# Patient Record
Sex: Male | Born: 1995 | Race: White | Hispanic: No | Marital: Single | Smoking: Never smoker
Health system: Southern US, Community
[De-identification: ages and names within clinical notes are randomized; demographics above are authoritative.]

## PROBLEM LIST (undated history)

## (undated) ENCOUNTER — Emergency Department (HOSPITAL_COMMUNITY): Admission: EM | Payer: Self-pay | Source: Home / Self Care

## (undated) DIAGNOSIS — B019 Varicella without complication: Secondary | ICD-10-CM

## (undated) DIAGNOSIS — F909 Attention-deficit hyperactivity disorder, unspecified type: Secondary | ICD-10-CM

## (undated) DIAGNOSIS — T8859XA Other complications of anesthesia, initial encounter: Secondary | ICD-10-CM

## (undated) DIAGNOSIS — H539 Unspecified visual disturbance: Secondary | ICD-10-CM

## (undated) DIAGNOSIS — T4145XA Adverse effect of unspecified anesthetic, initial encounter: Secondary | ICD-10-CM

## (undated) HISTORY — PX: WISDOM TOOTH EXTRACTION: SHX21

## (undated) HISTORY — PX: ADENOIDECTOMY: SUR15

## (undated) HISTORY — PX: TONSILLECTOMY: SUR1361

---
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
## 2016-01-10 ENCOUNTER — Other Ambulatory Visit: Payer: Self-pay | Admitting: Physician Assistant

## 2016-01-10 NOTE — Telephone Encounter (Signed)
Please review and advise.

## 2016-01-26 DIAGNOSIS — J0100 Acute maxillary sinusitis, unspecified: Secondary | ICD-10-CM | POA: Diagnosis not present

## 2016-02-07 ENCOUNTER — Other Ambulatory Visit: Payer: Self-pay | Admitting: Physician Assistant

## 2016-02-07 MED ORDER — CLONIDINE HCL 0.2 MG PO TABS: ORAL_TABLET | ORAL | 0 refills | Status: DC

## 2016-02-07 NOTE — Telephone Encounter (Signed)
Prescription sent to pharmacy.

## 2016-03-27 ENCOUNTER — Other Ambulatory Visit: Payer: Self-pay | Admitting: Physician Assistant

## 2016-04-11 DIAGNOSIS — R6889 Other general symptoms and signs: Secondary | ICD-10-CM | POA: Diagnosis not present

## 2016-04-11 DIAGNOSIS — K529 Noninfective gastroenteritis and colitis, unspecified: Secondary | ICD-10-CM | POA: Diagnosis not present

## 2016-06-20 ENCOUNTER — Other Ambulatory Visit: Payer: Self-pay | Admitting: Physician Assistant

## 2016-07-18 ENCOUNTER — Ambulatory Visit (INDEPENDENT_AMBULATORY_CARE_PROVIDER_SITE_OTHER): Payer: BLUE CROSS/BLUE SHIELD | Admitting: Physician Assistant

## 2016-07-18 DIAGNOSIS — R1314 Dysphagia, pharyngoesophageal phase: Secondary | ICD-10-CM | POA: Diagnosis not present

## 2016-07-18 DIAGNOSIS — B353 Tinea pedis: Secondary | ICD-10-CM

## 2016-07-18 DIAGNOSIS — F5101 Primary insomnia: Secondary | ICD-10-CM

## 2016-07-18 MED ORDER — OMEPRAZOLE 20 MG PO CPDR: 20.0000 mg | DELAYED_RELEASE_CAPSULE | Freq: Every day | ORAL | 3 refills | Status: DC

## 2016-07-18 MED ORDER — ECONAZOLE NITRATE 1 % EX CREA: TOPICAL_CREAM | Freq: Every day | CUTANEOUS | 0 refills | Status: DC

## 2016-07-18 MED ORDER — TRAZODONE HCL 50 MG PO TABS: 50.0000 mg | ORAL_TABLET | Freq: Every evening | ORAL | 2 refills | Status: DC | PRN

## 2016-07-18 NOTE — Patient Instructions (Signed)

## 2016-07-21 ENCOUNTER — Encounter: Payer: Self-pay | Admitting: Physician Assistant

## 2016-07-21 DIAGNOSIS — R1314 Dysphagia, pharyngoesophageal phase: Secondary | ICD-10-CM | POA: Insufficient documentation

## 2016-07-21 DIAGNOSIS — F5101 Primary insomnia: Secondary | ICD-10-CM | POA: Insufficient documentation

## 2016-07-21 NOTE — Progress Notes (Signed)
There were no vitals taken for this visit.   Subjective:    Patient ID: James Gilbert, male    DOB: 1995/02/09, 21 y.o.   MRN: [id number]  HPI: James Gilbert is a 21 y.o. male presenting on 07/18/2016 for Choking on food and Wants feet checked  This patient comes in for periodic recheck on medications and conditions including dysphagia over 6-12 months, continuing to worsens. Notices with bread and meat. The discomfort occurs ar the area just below the larynx.   Also having worsening insomnia, clonidine does not seem to be working anymore. Also some peeling and fungus on the side of his feet.   All medications are reviewed today. There are no reports of any problems with the medications. All of the medical conditions are reviewed and updated.  Lab work is reviewed and will be ordered as medically necessary. There are no new problems reported with today's visit.   Relevant past medical, surgical, family and social history reviewed and updated as indicated. Allergies and medications reviewed and updated.  Past Medical History:  Diagnosis Date  . ADHD (attention deficit hyperactivity disorder)   . Anesthesia complication    nausea/ vomitting  . Varicella   . Vision abnormalities    wears glass    Past Surgical History:  Procedure Laterality Date  . ADENOIDECTOMY    . ORIF FEMUR FRACTURE Right 06/22/2013   Procedure: OPEN REDUCTION INTERNAL FIXATION MEDIAL CONDYLE;  Surgeon: Cammy Copa, MD;  Location: [facility] OR;  Service: Orthopedics;  Laterality: Right;  OPEN REDUCTION INTERNAL FIXATION MEDIAL CONDYLE  . ORIF TIBIA PLATEAU Right 06/22/2013   Procedure: OPEN REDUCTION INTERNAL FIXATION (ORIF) TIBIAL PLATEAU;  Surgeon: Cammy Copa, MD;  Location: [facility] OR;  Service: Orthopedics;  Laterality: Right;  . TONSILLECTOMY      Review of Systems  Constitutional: Negative.  Negative for appetite change and fatigue.  HENT: Negative.   Eyes: Negative.  Negative for pain and  visual disturbance.  Respiratory: Negative.  Negative for cough, chest tightness, shortness of breath and wheezing.   Cardiovascular: Negative.  Negative for chest pain, palpitations and leg swelling.  Gastrointestinal: Negative.  Negative for abdominal pain, diarrhea, nausea and vomiting.  Endocrine: Negative.   Genitourinary: Negative.   Musculoskeletal: Negative.   Skin: Negative.  Negative for color change and rash.  Neurological: Negative.  Negative for weakness, numbness and headaches.  Psychiatric/Behavioral: Negative.     Allergies as of 07/18/2016   No Known Allergies     Medication List       Accurate as of 07/18/16 11:59 PM. Always use your most recent med list.          econazole nitrate 1 % cream Apply topically daily.   omeprazole 20 MG capsule Commonly known as:  PRILOSEC Take 1 capsule (20 mg total) by mouth daily.   traZODone 50 MG tablet Commonly known as:  DESYREL Take 1-2 tablets (50-100 mg total) by mouth at bedtime as needed for sleep.          Objective:    There were no vitals taken for this visit.  No Known Allergies  Physical Exam  Constitutional: He appears well-developed and well-nourished.  HENT:  Head: Normocephalic and atraumatic.  Eyes: Conjunctivae and EOM are normal. Pupils are equal, round, and reactive to light.  Neck: Normal range of motion. Neck supple.  Cardiovascular: Normal rate, regular rhythm and normal heart sounds.   Pulmonary/Chest: Effort normal and breath sounds normal.  Abdominal: Soft. Bowel sounds are normal.  Musculoskeletal: Normal range of motion.  Skin: Skin is warm and dry. Rash noted. There is erythema.        Assessment & Plan:   1. Pharyngoesophageal dysphagia - Ambulatory referral to Gastroenterology - omeprazole (PRILOSEC) 20 MG capsule; Take 1 capsule (20 mg total) by mouth daily.  Dispense: 30 capsule; Refill: 3  2. Primary insomnia - traZODone (DESYREL) 50 MG tablet; Take 1-2 tablets  (50-100 mg total) by mouth at bedtime as needed for sleep.  Dispense: 60 tablet; Refill: 2   Continue all other maintenance medications as listed above.  Follow up plan: Return in about 3 months (around 10/18/2016).  Educational handout given for GERD  James LofflerAngel S. Basha Krygier PA-C [facility] [address]  [place], [place] [address] [phone]   07/21/2016, 10:42 PM

## 2016-07-22 ENCOUNTER — Telehealth: Payer: Self-pay | Admitting: Physician Assistant

## 2016-07-22 DIAGNOSIS — F5101 Primary insomnia: Secondary | ICD-10-CM

## 2016-07-22 MED ORDER — TRAZODONE HCL 100 MG PO TABS: 200.0000 mg | ORAL_TABLET | Freq: Every evening | ORAL | 2 refills | Status: DC | PRN

## 2016-07-22 NOTE — Telephone Encounter (Signed)
Per pt's mom Trazadone is not working for sleep Pt is taking 2 pills and is not sleeping at all Please advise

## 2016-07-22 NOTE — Telephone Encounter (Signed)
Pt's mother notified of script Asked for [facility] to be [facility] [place] Canceled at [facility] and resubmitted to the Drug [facility] per pt request

## 2016-07-24 ENCOUNTER — Encounter: Payer: Self-pay | Admitting: Gastroenterology

## 2016-07-30 ENCOUNTER — Other Ambulatory Visit: Payer: Self-pay | Admitting: Physician Assistant

## 2016-07-30 ENCOUNTER — Telehealth: Payer: Self-pay | Admitting: Physician Assistant

## 2016-07-30 MED ORDER — CLONIDINE HCL 0.2 MG PO TABS: 0.2000 mg | ORAL_TABLET | Freq: Three times a day (TID) | ORAL | 5 refills | Status: DC

## 2016-07-31 NOTE — Telephone Encounter (Signed)
Left message rx was sent to pharmacy

## 2016-09-18 ENCOUNTER — Ambulatory Visit: Payer: Medicaid Other | Admitting: Nurse Practitioner

## 2016-10-15 ENCOUNTER — Emergency Department (HOSPITAL_COMMUNITY)
Admission: EM | Admit: 2016-10-15 | Discharge: 2016-10-15 | Disposition: A | Discharge status: Home / Self Care | Payer: BLUE CROSS/BLUE SHIELD | Attending: Emergency Medicine | Admitting: Emergency Medicine

## 2016-10-15 ENCOUNTER — Emergency Department (HOSPITAL_COMMUNITY): Payer: BLUE CROSS/BLUE SHIELD

## 2016-10-15 ENCOUNTER — Encounter (HOSPITAL_COMMUNITY): Payer: Self-pay

## 2016-10-15 DIAGNOSIS — R1012 Left upper quadrant pain: Secondary | ICD-10-CM | POA: Insufficient documentation

## 2016-10-15 DIAGNOSIS — R112 Nausea with vomiting, unspecified: Secondary | ICD-10-CM

## 2016-10-15 DIAGNOSIS — Z79899 Other long term (current) drug therapy: Secondary | ICD-10-CM | POA: Diagnosis not present

## 2016-10-15 DIAGNOSIS — N200 Calculus of kidney: Secondary | ICD-10-CM | POA: Diagnosis not present

## 2016-10-15 DIAGNOSIS — R109 Unspecified abdominal pain: Secondary | ICD-10-CM

## 2016-10-15 LAB — URINALYSIS, ROUTINE W REFLEX MICROSCOPIC
Bilirubin Urine: NEGATIVE
Glucose, UA: NEGATIVE mg/dL
Ketones, ur: NEGATIVE mg/dL
Leukocytes, UA: NEGATIVE
Nitrite: NEGATIVE
PROTEIN: NEGATIVE mg/dL
SPECIFIC GRAVITY, URINE: 1.019 (ref 1.005–1.030)
pH: 5 (ref 5.0–8.0)

## 2016-10-15 MED ORDER — SODIUM CHLORIDE 0.9 % IV BOLUS (SEPSIS): 500.0000 mL | Freq: Once | INTRAVENOUS | Status: DC

## 2016-10-15 MED ORDER — FENTANYL CITRATE (PF) 100 MCG/2ML IJ SOLN
50.0000 ug | Freq: Once | INTRAMUSCULAR | Status: AC
  Administered 2016-10-15: 50 ug via INTRAVENOUS
  Filled 2016-10-15: qty 2

## 2016-10-15 MED ORDER — IOPAMIDOL (ISOVUE-300) INJECTION 61%
100.0000 mL | Freq: Once | INTRAVENOUS | Status: AC | PRN
  Administered 2016-10-15: 100 mL via INTRAVENOUS

## 2016-10-15 MED ORDER — ONDANSETRON HCL 4 MG/2ML IJ SOLN
4.0000 mg | Freq: Once | INTRAMUSCULAR | Status: AC
  Administered 2016-10-15: 4 mg via INTRAVENOUS
  Filled 2016-10-15: qty 2

## 2016-10-15 MED ORDER — SODIUM CHLORIDE 0.9 % IV BOLUS (SEPSIS)
1000.0000 mL | Freq: Once | INTRAVENOUS | Status: AC
  Administered 2016-10-15: 1000 mL via INTRAVENOUS

## 2016-10-15 NOTE — Discharge Instructions (Signed)
Get miralax and put one dose or 17 g in 8 ounces of water,  take 1 dose every 30 minutes for 2-3 hours or until you  get good results and then once or twice daily to prevent constipation. Recheck if you feel worse again.

## 2016-10-15 NOTE — ED Provider Notes (Signed)
[facility]Y DEPT Provider Note   CSN: [id number] Arrival date & time: 10/15/16  0122  Time seen 01:55 AM   History   Chief Complaint Chief Complaint  Patient presents with  . Abdominal Pain    HPI James Gilbert is a 21 y.o. male.  HPI patient reports about 1-1/2 hours ago while playing video games he had acute onset of left upper lateral abdominal pain. He states he feels like he is unable to urinate. He had nausea and multiple episodes of vomiting. His mother states he was unable to sit still. He states the pain is sharp and constant. He states now he feels like he's having gas pain. He states sometimes the pain radiated into his left lower groin. He states certain movements makes the pain worse, laying down makes it feel better. He states his pain is improved now. He states he's never had this pain before, he denies any change in his activity. His mother reports she has a history of kidney stones and her father had kidney stones. Patient states he drinks about 2 cans of soda a day. He normally doesn't drink a lot of milk however he did drink some milk about 5 seconds before this pain started tonight. He normally doesn't have any problems drinking  PCP Prudy Feeler  Past Medical History:  Diagnosis Date  . ADHD (attention deficit hyperactivity disorder)   . Anesthesia complication    nausea/ vomitting  . Varicella   . Vision abnormalities    wears glass    Patient Active Problem List   Diagnosis Date Noted  . Pharyngoesophageal dysphagia 07/21/2016  . Primary insomnia 07/21/2016  . Tibial plateau fracture 06/22/2013    Past Surgical History:  Procedure Laterality Date  . ADENOIDECTOMY    . ORIF FEMUR FRACTURE Right 06/22/2013   Procedure: OPEN REDUCTION INTERNAL FIXATION MEDIAL CONDYLE;  Surgeon: Cammy Copa, MD;  Location: [facility] OR;  Service: Orthopedics;  Laterality: Right;  OPEN REDUCTION INTERNAL FIXATION MEDIAL CONDYLE  . ORIF TIBIA PLATEAU Right 06/22/2013   Procedure: OPEN REDUCTION INTERNAL FIXATION (ORIF) TIBIAL PLATEAU;  Surgeon: Cammy Copa, MD;  Location: [facility] OR;  Service: Orthopedics;  Laterality: Right;  . TONSILLECTOMY         Home Medications    States he is only taking clonidine  Prior to Admission medications   Medication Sig Start Date End Date Taking? Authorizing Provider  cloNIDine (CATAPRES) 0.2 MG tablet TAKE 1 TO 2 TABLETS AT BEDTIME 07/31/16   Remus Loffler, PA-C  cloNIDine (CATAPRES) 0.2 MG tablet Take 1-2 tablets (0.2-0.4 mg total) by mouth 3 (three) times daily. 07/30/16   Remus Loffler, PA-C  econazole nitrate 1 % cream Apply topically daily. 07/18/16   Remus Loffler, PA-C  omeprazole (PRILOSEC) 20 MG capsule Take 1 capsule (20 mg total) by mouth daily. 07/18/16   Remus Loffler, PA-C    Family History Family History  Problem Relation Age of Onset  . Hypertension Father   . Diabetes Father   . Mental retardation Maternal Uncle   . Arthritis Maternal Grandmother   . Diabetes Paternal Grandmother     Social History Social History  Substance Use Topics  . Smoking status: Never Smoker  . Smokeless tobacco: Never Used  . 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   Review of Systems Review of Systems  All other systems reviewed and are  negative.    Physical Exam Updated Vital Signs BP (!) 154/95 (BP Location: Left Arm)   Pulse 75   Temp 98.3 F (36.8 C) (Oral)   Resp 18   Ht 5\' 10"  (1.778 m)   Wt 113.4 kg (250 lb)   SpO2 100%   BMI 35.87 kg/m   Vital signs normal except hypertension   Physical Exam  Constitutional: He is oriented to person, place, and time. He appears well-developed and well-nourished.  Non-toxic appearance. He does not appear ill. No distress.  HENT:  Head: Normocephalic and atraumatic.  Right Ear: External ear normal.  Left Ear: External ear normal.  Nose: Nose normal. No mucosal edema or  rhinorrhea.  Mouth/Throat: Oropharynx is clear and moist and mucous membranes are normal. No dental abscesses or uvula swelling.  Eyes: Pupils are equal, round, and reactive to light. Conjunctivae and EOM are normal.  Neck: Normal range of motion and full passive range of motion without pain. Neck supple.  Cardiovascular: Normal rate, regular rhythm and normal heart sounds.  Exam reveals no gallop and no friction rub.   No murmur heard. Pulmonary/Chest: Effort normal and breath sounds normal. No respiratory distress. He has no wheezes. He has no rhonchi. He has no rales. He exhibits no tenderness and no crepitus.  Abdominal: Soft. Normal appearance and bowel sounds are normal. He exhibits no distension. There is tenderness. There is no rebound and no guarding.    Genitourinary:  Genitourinary Comments: No CVA tenderness  Musculoskeletal: Normal range of motion. He exhibits no edema or tenderness.  Moves all extremities well.   Neurological: He is alert and oriented to person, place, and time. He has normal strength. No cranial nerve deficit.  Skin: Skin is warm, dry and intact. No rash noted. No erythema. There is pallor.  Psychiatric: He has a normal mood and affect. His speech is normal and behavior is normal. His mood appears not anxious.  Nursing note and vitals reviewed.    ED Treatments / Results  Labs (all labs ordered are listed, but only abnormal results are displayed) Results for orders placed or performed during the hospital encounter of 10/15/16  Urinalysis, Routine w reflex microscopic  Result Value Ref Range   Color, Urine YELLOW YELLOW   APPearance HAZY (A) CLEAR   Specific Gravity, Urine 1.019 1.005 - 1.030   pH 5.0 5.0 - 8.0   Glucose, UA NEGATIVE NEGATIVE mg/dL   Hgb urine dipstick LARGE (A) NEGATIVE   Bilirubin Urine NEGATIVE NEGATIVE   Ketones, ur NEGATIVE NEGATIVE mg/dL   Protein, ur NEGATIVE NEGATIVE mg/dL   Nitrite NEGATIVE NEGATIVE   Leukocytes, UA  NEGATIVE NEGATIVE   RBC / HPF TOO NUMEROUS TO COUNT 0 - 5 RBC/hpf   WBC, UA 0-5 0 - 5 WBC/hpf   Bacteria, UA RARE (A) NONE SEEN   Squamous Epithelial / LPF 0-5 (A) NONE SEEN   Mucus PRESENT    Laboratory interpretation all normal except hematuria c/w renal stones     EKG  EKG Interpretation None       Radiology Ct Abdomen Pelvis W Contrast  Result Date: 10/15/2016 CLINICAL DATA:  Severe left upper quadrant pain. EXAM: CT ABDOMEN AND PELVIS WITH CONTRAST TECHNIQUE: Multidetector CT imaging of the abdomen and pelvis was performed using the standard protocol following bolus administration of intravenous contrast. CONTRAST:  100mL ISOVUE-300 IOPAMIDOL (ISOVUE-300) INJECTION 61% COMPARISON:  Noncontrast examination 10/15/2016 FINDINGS: Lower chest: Lung bases are clear. Hepatobiliary: No focal liver abnormality is seen.  No gallstones, gallbladder wall thickening, or biliary dilatation. Pancreas: Unremarkable. No pancreatic ductal dilatation or surrounding inflammatory changes. Spleen: Normal in size without focal abnormality. No focal infarcts identified. Adrenals/Urinary Tract: Adrenal glands are unremarkable. Kidneys are normal, without renal calculi, focal lesion, or hydronephrosis. Bladder is unremarkable. Stomach/Bowel: Stomach is within normal limits. Appendix appears normal. No evidence of bowel wall thickening, distention, or inflammatory changes. Vascular/Lymphatic: No significant vascular findings are present. No enlarged abdominal or pelvic lymph nodes. Reproductive: Prostate is unremarkable. Other: No abdominal wall hernia or abnormality. No abdominopelvic ascites. Musculoskeletal: No acute or significant osseous findings. IMPRESSION: No acute process demonstrated in the abdomen or pelvis. No evidence of splenic infarct. Electronically Signed   By: Burman Nieves M.D.   On: 10/15/2016 04:48   Ct Renal Stone Study  Result Date: 10/15/2016 CLINICAL DATA:  Patient reports waking from  sleep with sharp pain to left flank and gas feeling in abdomen. Patient felt nauseated, vomited once and had normal bowel movements EXAM: CT ABDOMEN AND PELVIS WITHOUT CONTRAST TECHNIQUE: Multidetector CT imaging of the abdomen and pelvis was performed following the standard protocol without IV contrast. COMPARISON:  01/16/2015 FINDINGS: Lower chest: The lung bases are clear. Diffuse thickening of the lower esophageal wall may indicate changes due to esophageal reflux disease. Hepatobiliary: No focal liver abnormality is seen. No gallstones, gallbladder wall thickening, or biliary dilatation. Pancreas: Unremarkable. No pancreatic ductal dilatation or surrounding inflammatory changes. Spleen: Normal in size without focal abnormality. Adrenals/Urinary Tract: No adrenal gland nodules. Punctate sized stones demonstrated in the both kidneys. No hydronephrosis or hydroureter. No ureteral stones. Bladder is decompressed. Stomach/Bowel: Stomach and small bowel are decompressed. Colon is mostly decompressed with scattered stool. No inflammatory changes are appreciated. Appendix is normal. Vascular/Lymphatic: No significant vascular findings are present. No enlarged abdominal or pelvic lymph nodes. Reproductive: Prostate is unremarkable. Other: No abdominal wall hernia or abnormality. No abdominopelvic ascites. Musculoskeletal: No acute or significant osseous findings. IMPRESSION: Tiny punctate sized nonobstructing stones in the kidneys. No hydronephrosis or hydroureter. No ureteral stone or obstruction. Circumferential thickening of the lower esophageal wall may indicate reflux disease. Electronically Signed   By: Burman Nieves M.D.   On: 10/15/2016 03:08    Procedures Procedures (including critical care time)  Medications Ordered in ED Medications  sodium chloride 0.9 % bolus 500 mL (not administered)  sodium chloride 0.9 % bolus 1,000 mL (0 mLs Intravenous Stopped 10/15/16 0501)  ondansetron (ZOFRAN) injection  4 mg (4 mg Intravenous Given 10/15/16 0407)  fentaNYL (SUBLIMAZE) injection 50 mcg (50 mcg Intravenous Given 10/15/16 0407)  iopamidol (ISOVUE-300) 61 % injection 100 mL (100 mLs Intravenous Contrast Given 10/15/16 0422)     Initial Impression / Assessment and Plan / ED Course  I have reviewed the triage vital signs and the nursing notes.  Pertinent labs & imaging results that were available during my care of the patient were reviewed by me and considered in my medical decision making (see chart for details).     We discussed patient's symptoms seemed consistent with a kidney stone. At this time however he is refusing pain or nausea medication. He was told if he changed his mind to let the nursing staff know. CT renal scan was ordered  When I went back to talk to the patient about the results of his CT scan he was in the bathroom and family states his pain had come back.  4 AM with chaperone present patient's genitalia was examined. His testicles are nontender  and are normal size without enlargement or change in firmness. His penis is normal. At this point we discussed doing a CT scan with contrast to look for a splenic infarction. Patient was given IV pain and nausea medication now, he had refused earlier.  Patient CT scan does not show a reason for his pain. He does admit to putting together a bed today, possibly he has a strained muscle.  CT scan with contrast was done and did not show any acute findings. Interestingly throughout his ED visit patient has been going to the bathroom to have bowel movements which he states are like balls. Patient will be treated for constipation.  Recheck at time of discharge patient has good colorface now for the first time. He states he feels much improved. He is having good urinary output after the IV fluids. He states his pain is gone. We discussed his results and need for treatment for constipation and he is agreeable.  Final Clinical Impressions(s) / ED  Diagnoses   Final diagnoses:  Left lateral abdominal pain  Non-intractable vomiting with nausea, unspecified vomiting type    New Prescriptions New Prescriptions   No medications on file  OTC miralax  Plan discharge  Devoria Albe, MD, Concha Pyo, MD 10/15/16 [phone]

## 2016-10-15 NOTE — ED Triage Notes (Signed)
Pt reports waking from sleep with sharp pain to LUQ and gas feeling in abdomen. Pt felt nauseated, vomited once and had BM (normal per report).

## 2016-10-15 NOTE — ED Notes (Signed)
Checked with patient for urine sample,patient stated that he doesn't have to go right now,will try back in 30 minutes.

## 2016-12-30 ENCOUNTER — Other Ambulatory Visit: Payer: Self-pay | Admitting: Physician Assistant

## 2017-01-21 ENCOUNTER — Other Ambulatory Visit: Payer: Self-pay | Admitting: Physician Assistant

## 2017-02-27 ENCOUNTER — Other Ambulatory Visit: Payer: Self-pay | Admitting: Physician Assistant

## 2017-02-28 NOTE — Telephone Encounter (Signed)
Last seen 07/18/16

## 2017-03-25 ENCOUNTER — Other Ambulatory Visit: Payer: Self-pay | Admitting: Physician Assistant

## 2017-04-07 ENCOUNTER — Encounter: Payer: Self-pay | Admitting: Physician Assistant

## 2017-04-07 ENCOUNTER — Ambulatory Visit: Payer: BLUE CROSS/BLUE SHIELD | Admitting: Physician Assistant

## 2017-04-07 VITALS — BP 117/71 | HR 70 | Ht 70.0 in | Wt 260.0 lb

## 2017-04-07 DIAGNOSIS — F5101 Primary insomnia: Secondary | ICD-10-CM

## 2017-04-07 MED ORDER — CLONIDINE HCL 0.2 MG PO TABS: 0.2000 mg | ORAL_TABLET | Freq: Three times a day (TID) | ORAL | 5 refills | Status: DC

## 2017-04-07 NOTE — Patient Instructions (Signed)

## 2017-04-07 NOTE — Progress Notes (Signed)
BP 117/71   Pulse 70   Ht 5\' 10"  (1.778 m)   Wt 260 lb (117.9 kg)   BMI 37.31 kg/m    Subjective:    Patient ID: James Gilbert, male    DOB: 06/28/1995, 22 y.o.   MRN: [id number]  HPI: James Gilbert is a 22 y.o. male presenting on 04/07/2017 for Medication Refill Patient comes in for recheck on his insomnia.  He was placed on clonidine many years ago when he did take a stimulant for his ADHD.  He does not take the stimulant anymore.  He is doing well in college and will graduate this spring.  He does use the clonidine nightly for sleep.  He is try to go off of it without success.  He tried trazodone and that gave him chest pain.  He has never tried Benadryl or any over-the-counter medication that could make him feel more drowsy.  Past Medical History:  Diagnosis Date  . ADHD (attention deficit hyperactivity disorder)   . Anesthesia complication    nausea/ vomitting  . Varicella   . Vision abnormalities    wears glass   Relevant past medical, surgical, family and social history reviewed and updated as indicated. Interim medical history since our last visit reviewed. Allergies and medications reviewed and updated. DATA REVIEWED: CHART IN EPIC  Family History reviewed for pertinent findings.  Review of Systems  Constitutional: Negative.  Negative for appetite change and fatigue.  HENT: Negative.   Eyes: Negative.  Negative for pain and visual disturbance.  Respiratory: Negative.  Negative for cough, chest tightness, shortness of breath and wheezing.   Cardiovascular: Negative.  Negative for chest pain, palpitations and leg swelling.  Gastrointestinal: Negative.  Negative for abdominal pain, diarrhea, nausea and vomiting.  Endocrine: Negative.   Genitourinary: Negative.   Musculoskeletal: Negative.   Skin: Negative.  Negative for color change and rash.  Neurological: Negative.  Negative for weakness, numbness and headaches.  Psychiatric/Behavioral: Positive for sleep  disturbance.    Allergies as of 04/07/2017   No Known Allergies     Medication List        Accurate as of 04/07/17  3:18 PM. Always use your most recent med list.          cloNIDine 0.2 MG tablet Commonly known as:  CATAPRES Take 1-2 tablets (0.2-0.4 mg total) by mouth 3 (three) times daily.          Objective:    BP 117/71   Pulse 70   Ht 5\' 10"  (1.778 m)   Wt 260 lb (117.9 kg)   BMI 37.31 kg/m   No Known Allergies  Wt Readings from Last 3 Encounters:  04/07/17 260 lb (117.9 kg)  10/15/16 250 lb (113.4 kg) (>99 %, Z= 2.38)*  06/22/13 199 lb 15.3 oz (90.7 kg) (97 %, Z= 1.82)*   * Growth percentiles are based on CDC (Boys, 2-20 Years) data.    Physical Exam  Constitutional: He appears well-developed and well-nourished. No distress.  HENT:  Head: Normocephalic and atraumatic.  Eyes: Conjunctivae and EOM are normal. Pupils are equal, round, and reactive to light.  Cardiovascular: Normal rate, regular rhythm and normal heart sounds.  Pulmonary/Chest: Effort normal and breath sounds normal. No respiratory distress.  Skin: Skin is warm and dry.  Psychiatric: He has a normal mood and affect. His behavior is normal.  Nursing note and vitals reviewed.       Assessment & Plan:   1. Primary insomnia  Avoid caffeine in the later part of day - cloNIDine (CATAPRES) 0.2 MG tablet; Take 1-2 tablets (0.2-0.4 mg total) by mouth 3 (three) times daily.  Dispense: 60 tablet; Refill: 5   Continue all other maintenance medications as listed above.  Follow up plan: Return in about 6 months (around 10/08/2017).  Educational handout given for insomnia  Remus LofflerAngel S. Tayelor Osborne PA-C [facility] [address]  [place], [place] [address] [phone]   04/07/2017, 3:18 PM

## 2017-07-24 ENCOUNTER — Encounter: Payer: Self-pay | Admitting: Physician Assistant

## 2017-07-24 ENCOUNTER — Ambulatory Visit: Payer: BLUE CROSS/BLUE SHIELD | Admitting: Physician Assistant

## 2017-07-24 VITALS — BP 130/92 | HR 108 | Temp 97.3°F | Ht 70.0 in | Wt 251.8 lb

## 2017-07-24 DIAGNOSIS — K529 Noninfective gastroenteritis and colitis, unspecified: Secondary | ICD-10-CM

## 2017-07-24 MED ORDER — ONDANSETRON 8 MG PO TBDP: 8.0000 mg | ORAL_TABLET | Freq: Three times a day (TID) | ORAL | 0 refills | Status: DC | PRN

## 2017-07-25 NOTE — Progress Notes (Signed)
BP (!) 130/92   Pulse (!) 108   Temp (!) 97.3 F (36.3 C) (Oral)   Ht 5\' 10"  (1.778 m)   Wt 251 lb 12.8 oz (114.2 kg)   BMI 36.13 kg/m    Subjective:    Patient ID: James Gilbert, male    DOB: [birth year], 22 y.o.   MRN: [id number]  HPI: James Gilbert is a 22 y.o. male presenting on 07/24/2017 for Emesis (x 3 day ) and Diarrhea  This patient comes in with 3 day history of nausea and vomiting.  In the beginning nausea and vomiting were the only symptoms and halfway through more diarrhea.  Last time the patient ate was today Positive exposure to others with gastroenteritis Denies fever. Denies blood.   Past Medical History:  Diagnosis Date  . ADHD (attention deficit hyperactivity disorder)   . Anesthesia complication    nausea/ vomitting  . Varicella   . Vision abnormalities    wears glass   Relevant past medical, surgical, family and social history reviewed and updated as indicated. Interim medical history since our last visit reviewed. Allergies and medications reviewed and updated. DATA REVIEWED: CHART IN EPIC  Family History reviewed for pertinent findings.  Review of Systems  Constitutional: Positive for fatigue and fever. Negative for appetite change.  Eyes: Negative for pain and visual disturbance.  Respiratory: Negative.  Negative for cough, chest tightness, shortness of breath and wheezing.   Cardiovascular: Negative.  Negative for chest pain, palpitations and leg swelling.  Gastrointestinal: Positive for abdominal pain, diarrhea, nausea and vomiting.  Genitourinary: Negative.   Musculoskeletal: Positive for myalgias.  Skin: Negative.  Negative for color change and rash.  Neurological: Negative.  Negative for weakness, numbness and headaches.  Psychiatric/Behavioral: Negative.     Allergies as of 07/24/2017   No Known Allergies     Medication List        Accurate as of 07/24/17 11:59 PM. Always use your most recent med list.          cloNIDine  0.2 MG tablet Commonly known as:  CATAPRES Take 1-2 tablets (0.2-0.4 mg total) by mouth 3 (three) times daily.   ondansetron 8 MG disintegrating tablet Commonly known as:  ZOFRAN ODT Take 1 tablet (8 mg total) by mouth every 8 (eight) hours as needed for nausea or vomiting.          Objective:    BP (!) 130/92   Pulse (!) 108   Temp (!) 97.3 F (36.3 C) (Oral)   Ht 5\' 10"  (1.778 m)   Wt 251 lb 12.8 oz (114.2 kg)   BMI 36.13 kg/m   No Known Allergies  Wt Readings from Last 3 Encounters:  07/24/17 251 lb 12.8 oz (114.2 kg)  04/07/17 260 lb (117.9 kg)  10/15/16 250 lb (113.4 kg) (>99 %, Z= 2.38)*   * Growth percentiles are based on CDC (Boys, 2-20 Years) data.    Physical Exam  Constitutional: He appears well-developed and well-nourished. He appears distressed.  HENT:  Head: Normocephalic and atraumatic.  Eyes: Pupils are equal, round, and reactive to light. Conjunctivae and EOM are normal.  Cardiovascular: Normal rate, regular rhythm and normal heart sounds.  Pulmonary/Chest: Effort normal and breath sounds normal. No respiratory distress.  Abdominal: Bowel sounds are increased. There is generalized tenderness. There is no rebound and no guarding.  Skin: Skin is warm and dry. He is not diaphoretic.  Psychiatric: He has a normal mood and affect. His behavior  is normal.  Nursing note and vitals reviewed.       Assessment & Plan:   1. Gastroenteritis - ondansetron (ZOFRAN ODT) 8 MG disintegrating tablet; Take 1 tablet (8 mg total) by mouth every 8 (eight) hours as needed for nausea or vomiting.  Dispense: 20 tablet; Refill: 0   Continue all other maintenance medications as listed above.  Follow up plan: No follow-ups on file.  Educational handout given for survey  Remus LofflerAngel S. Khori Rosevear PA-C [facility] [address]  [place], [place] [address] [phone]   07/25/2017, 11:33 AM

## 2017-09-18 ENCOUNTER — Other Ambulatory Visit: Payer: Self-pay | Admitting: Physician Assistant

## 2017-09-18 DIAGNOSIS — F5101 Primary insomnia: Secondary | ICD-10-CM

## 2018-01-16 ENCOUNTER — Other Ambulatory Visit: Payer: Self-pay | Admitting: Physician Assistant

## 2018-01-16 DIAGNOSIS — F5101 Primary insomnia: Secondary | ICD-10-CM

## 2018-01-16 NOTE — Telephone Encounter (Signed)
Needs to be seen

## 2018-01-16 NOTE — Telephone Encounter (Signed)
Patient will call back to make an appt.

## 2018-02-14 ENCOUNTER — Other Ambulatory Visit: Payer: Self-pay | Admitting: Physician Assistant

## 2018-02-14 DIAGNOSIS — F5101 Primary insomnia: Secondary | ICD-10-CM

## 2018-05-15 ENCOUNTER — Other Ambulatory Visit: Payer: Self-pay | Admitting: Physician Assistant

## 2018-05-15 DIAGNOSIS — F5101 Primary insomnia: Secondary | ICD-10-CM

## 2018-05-18 ENCOUNTER — Ambulatory Visit (INDEPENDENT_AMBULATORY_CARE_PROVIDER_SITE_OTHER): Payer: BLUE CROSS/BLUE SHIELD | Admitting: Physician Assistant

## 2018-05-18 ENCOUNTER — Encounter: Payer: Self-pay | Admitting: Physician Assistant

## 2018-05-18 ENCOUNTER — Other Ambulatory Visit: Payer: Self-pay

## 2018-05-18 DIAGNOSIS — F5101 Primary insomnia: Secondary | ICD-10-CM | POA: Diagnosis not present

## 2018-05-18 MED ORDER — CLONIDINE HCL 0.2 MG PO TABS
0.4000 mg | ORAL_TABLET | Freq: Every day | ORAL | 11 refills | Status: DC
Start: 2018-05-18 — End: 2019-06-03

## 2018-05-18 NOTE — Progress Notes (Signed)
    Telephone visit  Subjective: CC: Insomnia PCP: Remus Loffler, PA-C James Gilbert is a 23 y.o. male calls for telephone consult today. Patient provides verbal consent for consult held via phone.  Patient is identified with 2 separate identifiers.  At this time the entire area is on COVID-19 social distancing and stay home orders are in place.  Patient is of higher risk and therefore we are performing this by a virtual method.  Location of patient: Home Location of provider: [facility] Others present for call: No  This patient was for annual recheck on his medication.  He does take clonidine for sleep.  He started taking it as a young child when he was on stimulant medication for his ADHD.  It has continued to help him with sleep and he wants to continue taking it.  He currently works at the [employer].  He is on an every other week schedule.  He has not had any exposure to COVID-19.  He reports overall he is doing very well.   ROS: Per HPI  No Known Allergies Past Medical History:  Diagnosis Date  . ADHD (attention deficit hyperactivity disorder)   . Anesthesia complication    nausea/ vomitting  . Varicella   . Vision abnormalities    wears glass    Current Outpatient Medications:  .  cloNIDine (CATAPRES) 0.2 MG tablet, Take 2 tablets (0.4 mg total) by mouth at bedtime., Disp: 60 tablet, Rfl: 11 .  ondansetron (ZOFRAN ODT) 8 MG disintegrating tablet, Take 1 tablet (8 mg total) by mouth every 8 (eight) hours as needed for nausea or vomiting., Disp: 20 tablet, Rfl: 0  Assessment/ Plan: 23 y.o. male   1. Primary insomnia - cloNIDine (CATAPRES) 0.2 MG tablet; Take 2 tablets (0.4 mg total) by mouth at bedtime.  Dispense: 60 tablet; Refill: 11   Start time: 9:33 AM End time: 9:39 AM  Meds ordered this encounter  Medications  . cloNIDine (CATAPRES) 0.2 MG tablet    Sig: Take 2 tablets (0.4 mg total) by mouth at bedtime.    Dispense:  60 tablet   Refill:  11    Order Specific Question:   Supervising Provider    Answer:   Raliegh Ip [[id number]]    Prudy Feeler PA-C [facility] [phone]

## 2018-08-25 DIAGNOSIS — Z1159 Encounter for screening for other viral diseases: Secondary | ICD-10-CM | POA: Diagnosis not present

## 2018-08-31 ENCOUNTER — Telehealth: Payer: Self-pay | Admitting: Physician Assistant

## 2018-08-31 NOTE — Telephone Encounter (Signed)
He wants to know if he can do a televisit with you, so he can get a note for work,

## 2018-08-31 NOTE — Telephone Encounter (Signed)
For today.

## 2018-08-31 NOTE — Telephone Encounter (Signed)
appt for 09/04/18

## 2018-09-04 ENCOUNTER — Ambulatory Visit: Payer: BLUE CROSS/BLUE SHIELD | Admitting: Physician Assistant

## 2019-01-11 DIAGNOSIS — B078 Other viral warts: Secondary | ICD-10-CM | POA: Diagnosis not present

## 2019-01-11 DIAGNOSIS — B079 Viral wart, unspecified: Secondary | ICD-10-CM | POA: Diagnosis not present

## 2019-05-01 IMAGING — CT CT RENAL STONE PROTOCOL
2 of 4 series · 16 of 46 positions shown, 18 images · non-contrast
Comparison: 01/16/2015

CLINICAL DATA: Patient reports waking from sleep with sharp pain to
left flank and gas feeling in abdomen. Patient felt nauseated,
vomited once and had normal bowel movements

EXAM:
CT ABDOMEN AND PELVIS WITHOUT CONTRAST
TECHNIQUE: Multidetector CT imaging of the abdomen and pelvis was performed
following the standard protocol without IV contrast.

[Series 2: axial st · axial · 0.98mm/px · z∈[-706,-281]mm · 13 of 95 slices shown, 15 images]
[im 5/95  soft-tissue]
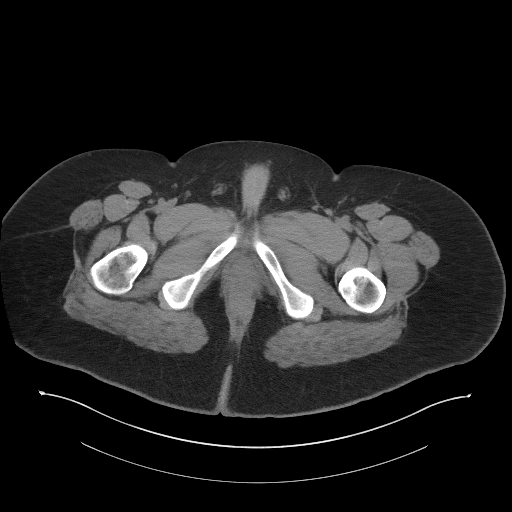
[im 5/95  bone]
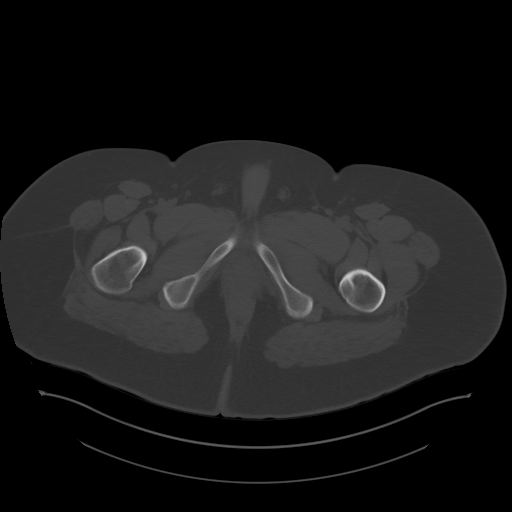
[im 13/95  soft-tissue]
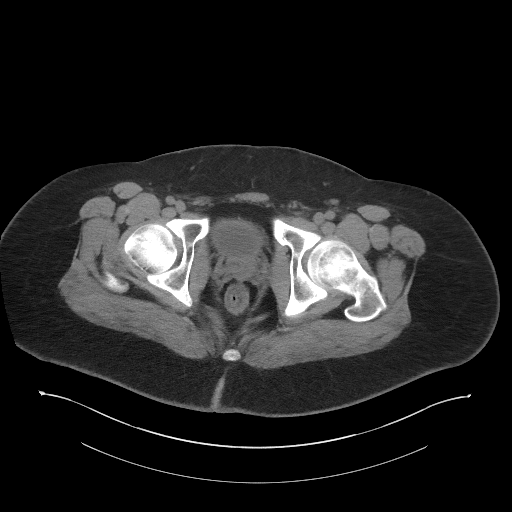
[im 22/95  soft-tissue]
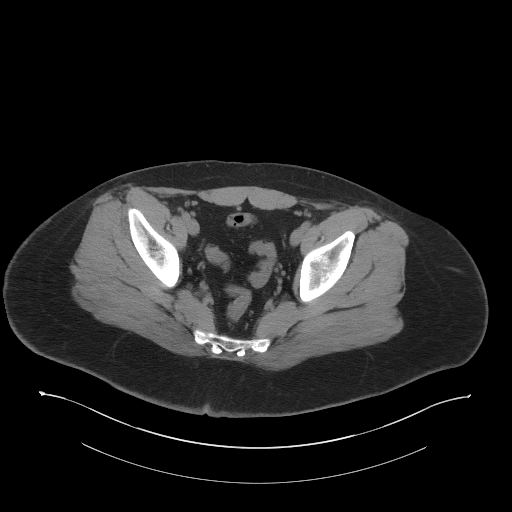
[im 26/95  soft-tissue]
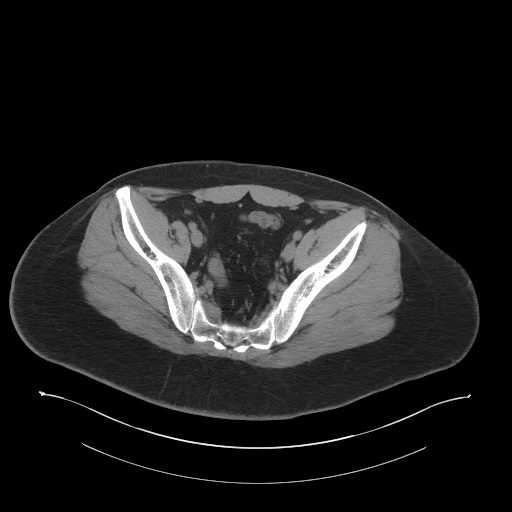
[im 35/95  soft-tissue]
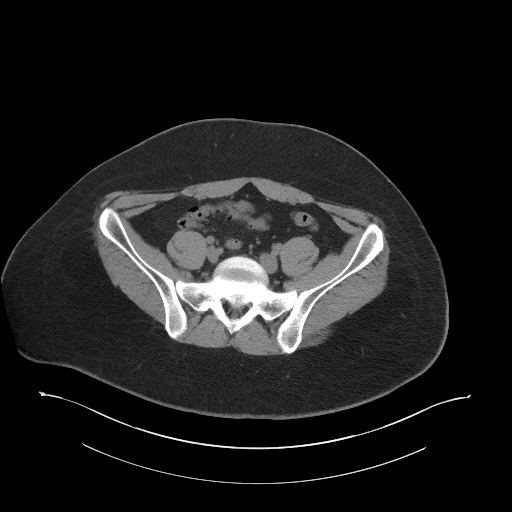
[im 39/95  soft-tissue]
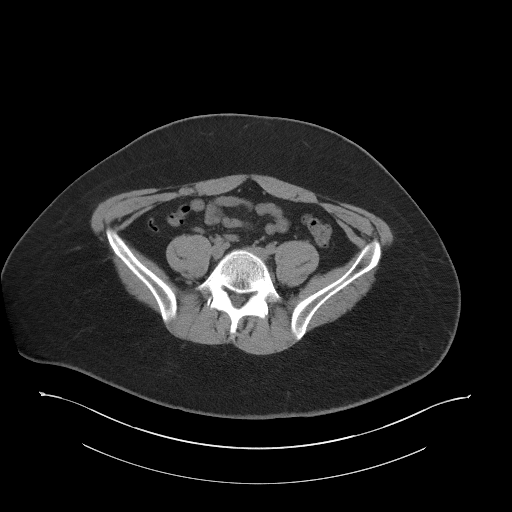
[im 48/95  soft-tissue]
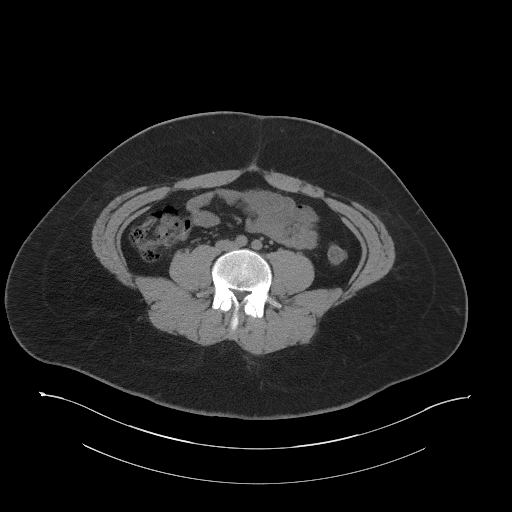
[im 56/95  soft-tissue]
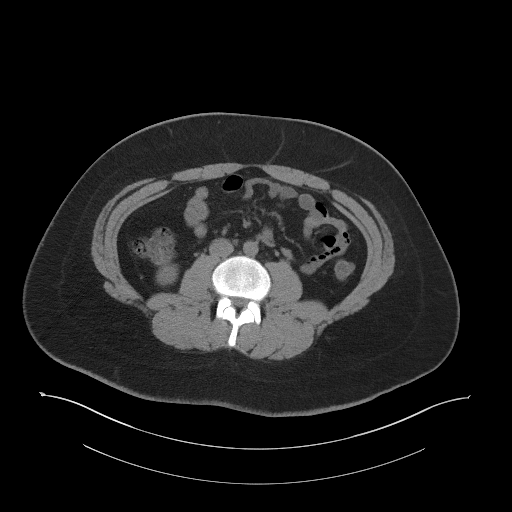
[im 60/95  soft-tissue]
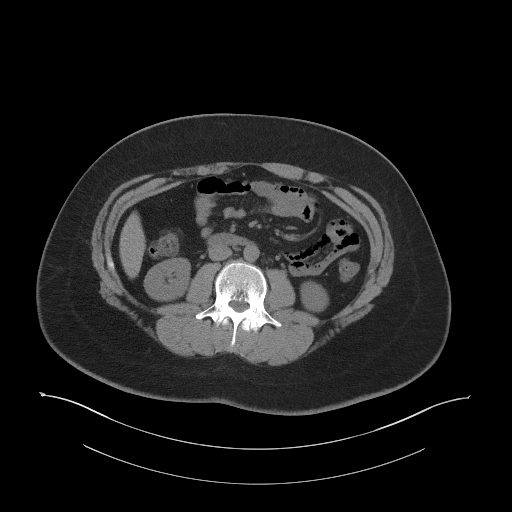
[im 60/95  bone]
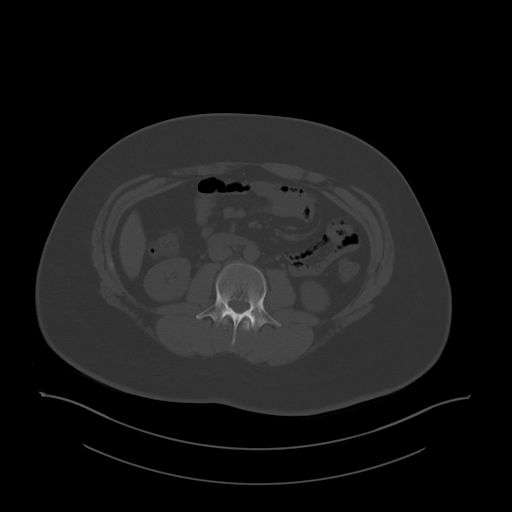
[im 69/95  soft-tissue]
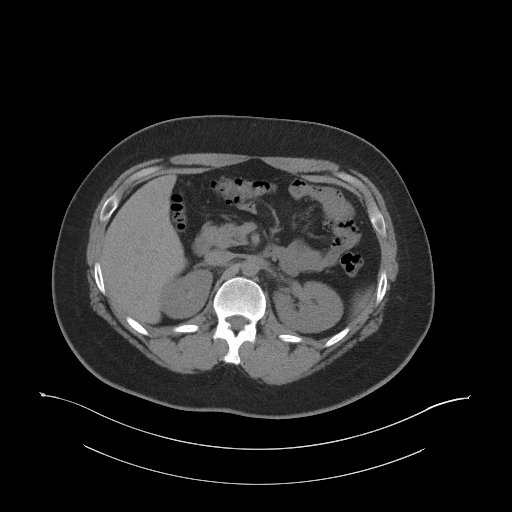
[im 73/95  soft-tissue]
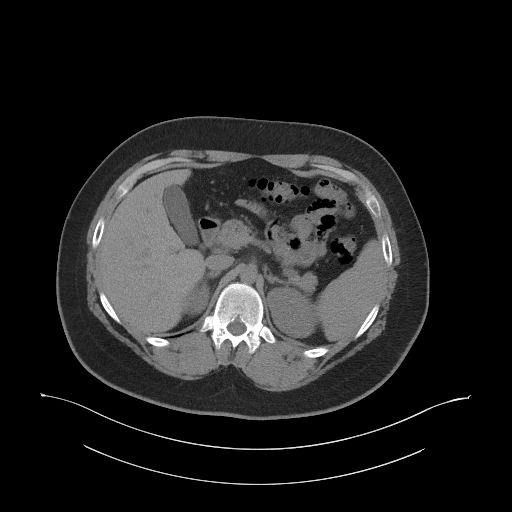
[im 82/95  soft-tissue]
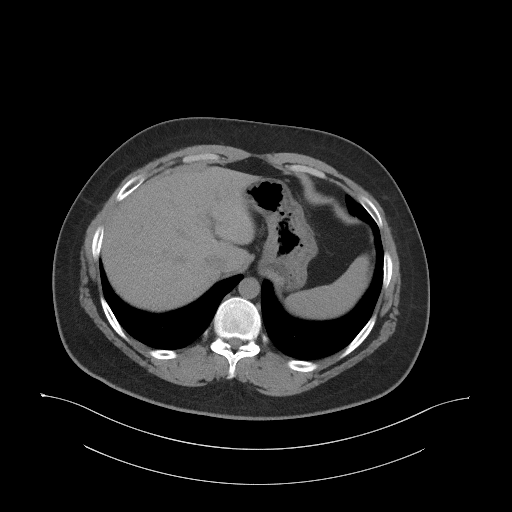
[im 90/95  soft-tissue]
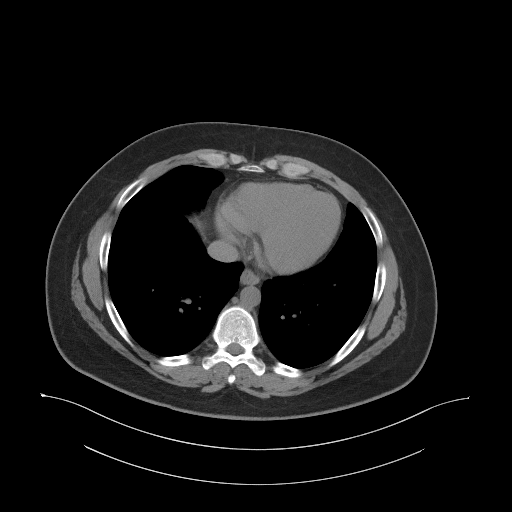

[Series 5: coronal st · coronal · 0.79mm/px · 3 of 93 slices shown]
[im 31/93  soft-tissue]
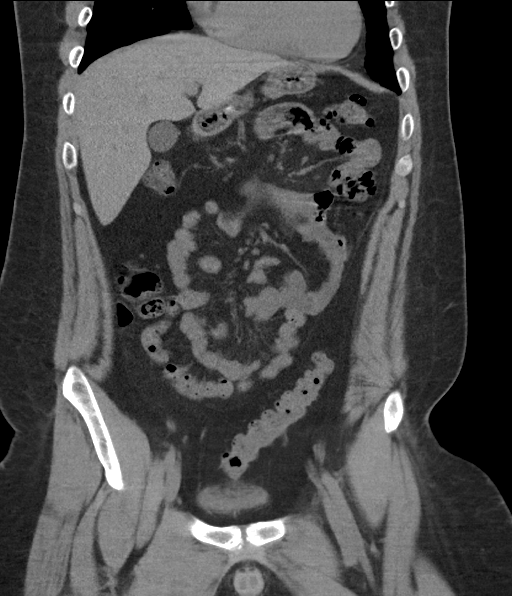
[im 41/93  soft-tissue]
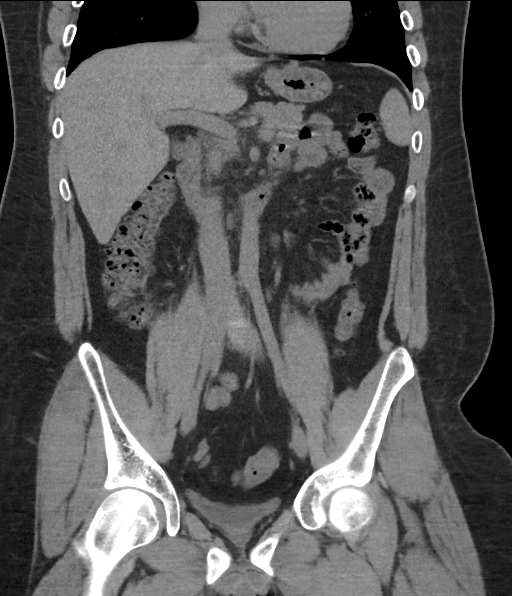
[im 52/93  soft-tissue]
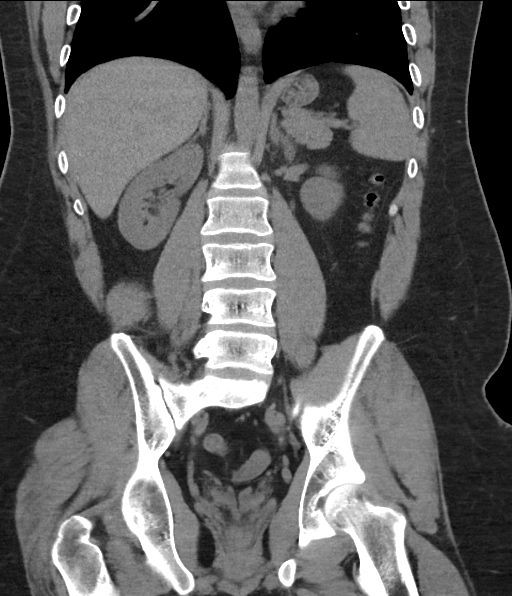

[16 of 46 positions shown; findings below may reference images not displayed]

FINDINGS: Lower chest: The lung bases are clear. Diffuse thickening of the
lower esophageal wall may indicate changes due to esophageal reflux
disease.

Hepatobiliary: No focal liver abnormality is seen. No gallstones,
gallbladder wall thickening, or biliary dilatation.

Pancreas: Unremarkable. No pancreatic ductal dilatation or
surrounding inflammatory changes.

Spleen: Normal in size without focal abnormality.

Adrenals/Urinary Tract: No adrenal gland nodules. Punctate sized
stones demonstrated in the both kidneys. No hydronephrosis or
hydroureter. No ureteral stones. Bladder is decompressed.

Stomach/Bowel: Stomach and small bowel are decompressed. Colon is
mostly decompressed with scattered stool. No inflammatory changes
are appreciated. Appendix is normal.

Vascular/Lymphatic: No significant vascular findings are present. No
enlarged abdominal or pelvic lymph nodes.

Reproductive: Prostate is unremarkable.

Other: No abdominal wall hernia or abnormality. No abdominopelvic
ascites.

Musculoskeletal: No acute or significant osseous findings.
IMPRESSION: Tiny punctate sized nonobstructing stones in the kidneys. No
hydronephrosis or hydroureter. No ureteral stone or obstruction.
Circumferential thickening of the lower esophageal wall may indicate
reflux disease.

## 2019-05-01 IMAGING — CT CT ABD-PELV W/ CM
2 of 4 series · 17 of 46 positions shown, 19 images · IV contrast (Isovue)
Comparison: Noncontrast examination 10/15/2016

CLINICAL DATA: Severe left upper quadrant pain.

EXAM:
CT ABDOMEN AND PELVIS WITH CONTRAST
TECHNIQUE: Multidetector CT imaging of the abdomen and pelvis was performed
using the standard protocol following bolus administration of
intravenous contrast.
CONTRAST:  100mL YIXRWC-2FF IOPAMIDOL (YIXRWC-2FF) INJECTION 61%

[Series 2: axial st · axial · 0.93mm/px · z∈[-662,-202]mm · 14 of 102 slices shown, 16 images]
[im 5/102  soft-tissue]
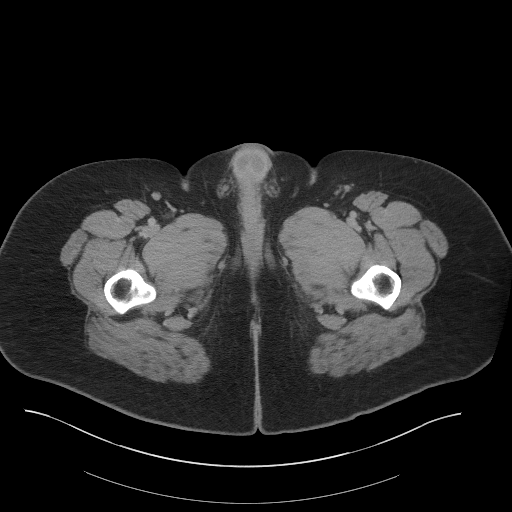
[im 5/102  bone]
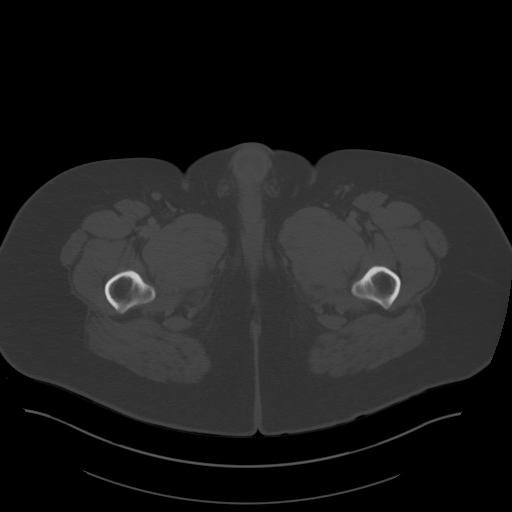
[im 14/102  soft-tissue]
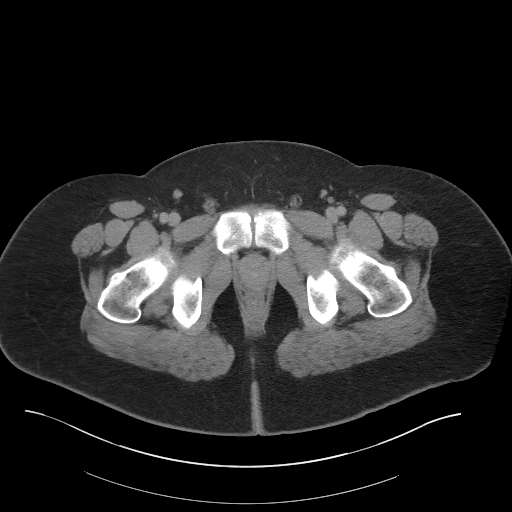
[im 18/102  soft-tissue]
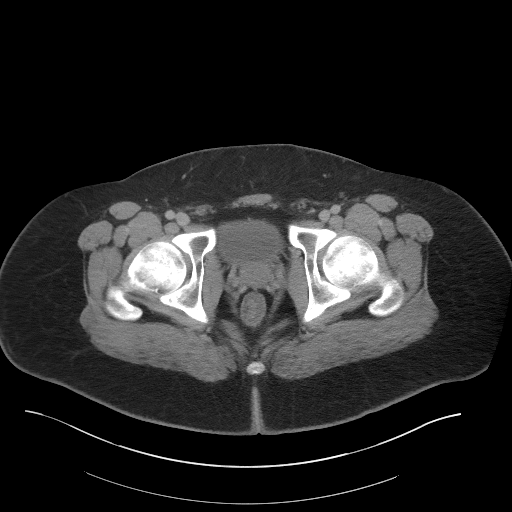
[im 27/102  soft-tissue]
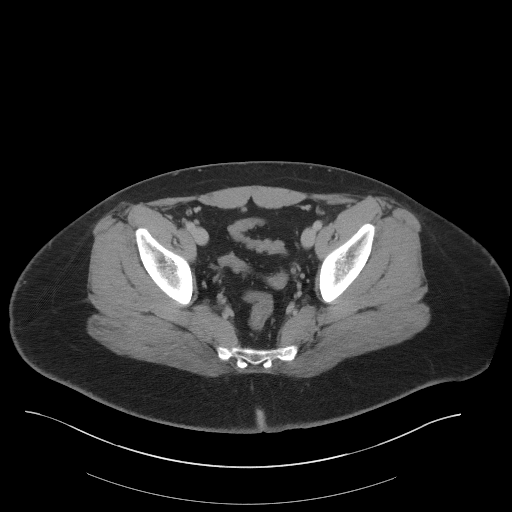
[im 36/102  soft-tissue]
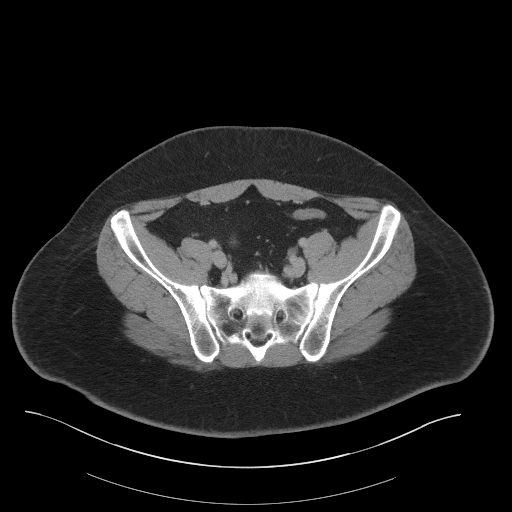
[im 40/102  soft-tissue]
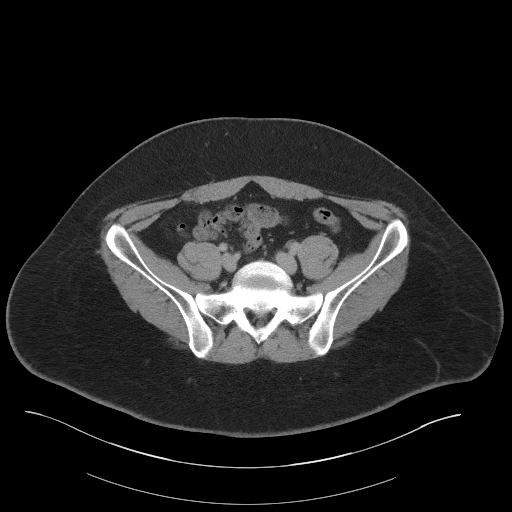
[im 49/102  soft-tissue]
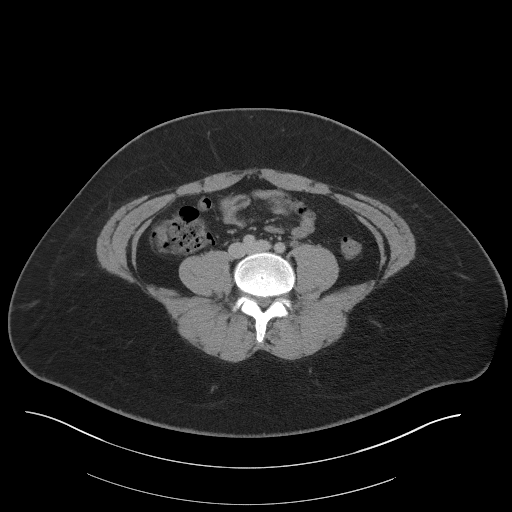
[im 53/102  soft-tissue]
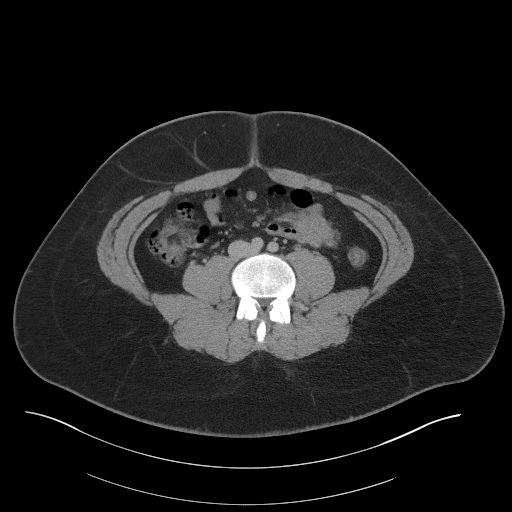
[im 62/102  soft-tissue]
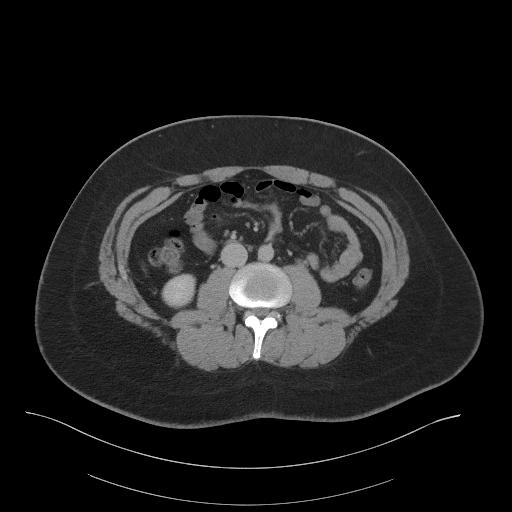
[im 62/102  bone]
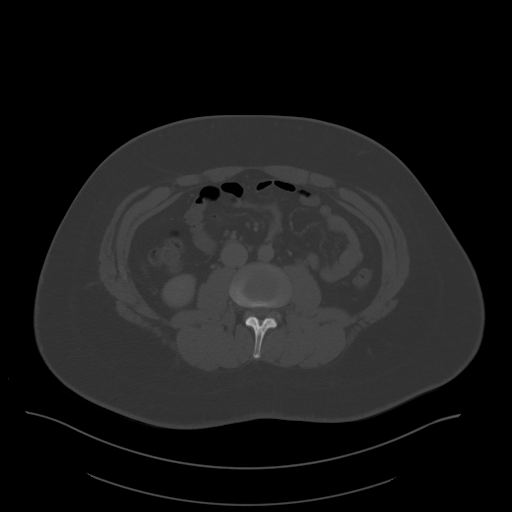
[im 66/102  soft-tissue]
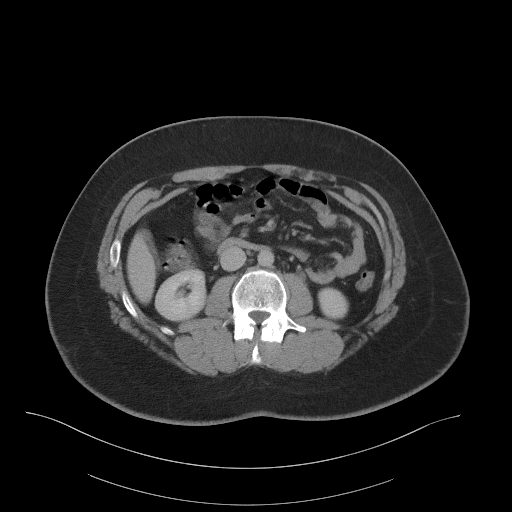
[im 75/102  soft-tissue]
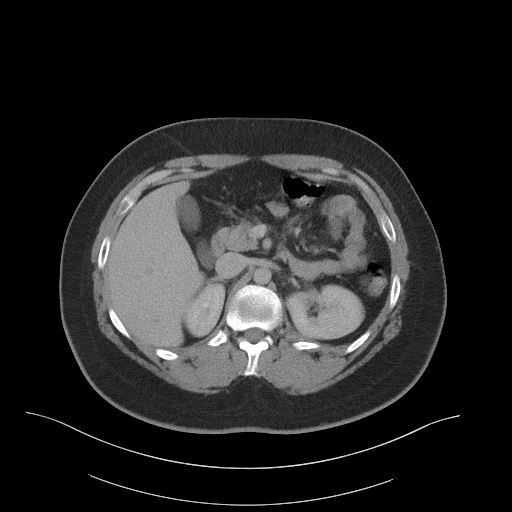
[im 84/102  soft-tissue]
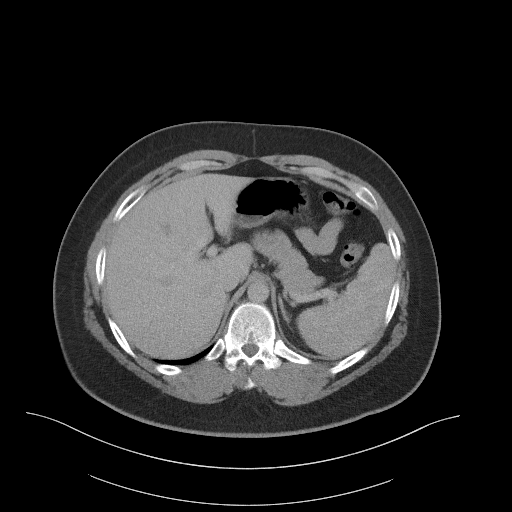
[im 88/102  soft-tissue]
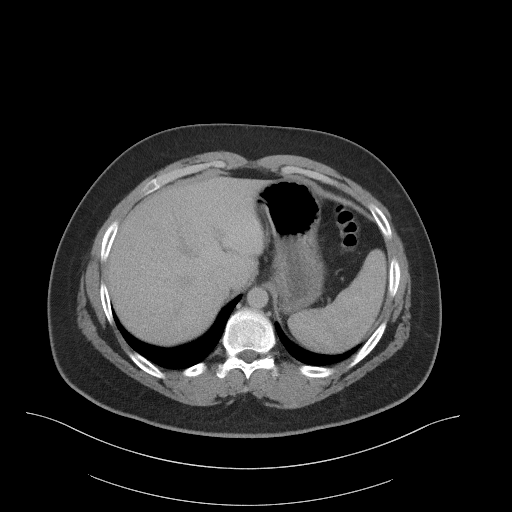
[im 97/102  soft-tissue]
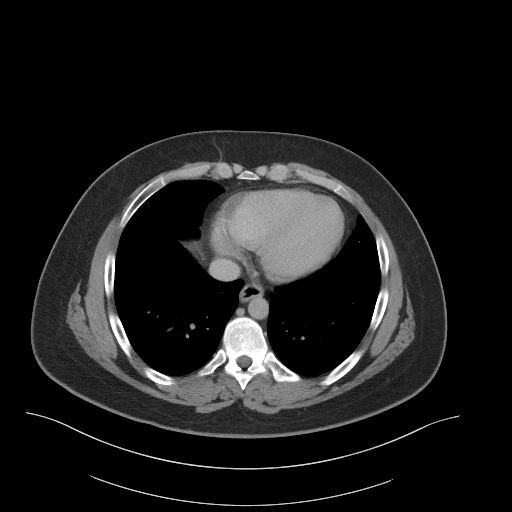

[Series 5: coronal st · coronal · 0.89mm/px · 3 of 87 slices shown]
[im 29/87  soft-tissue]
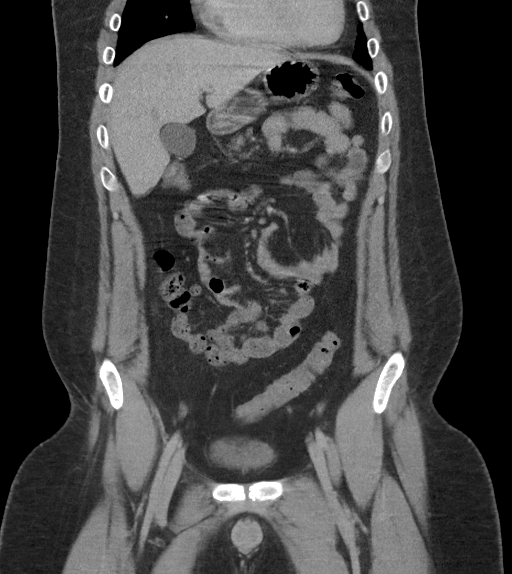
[im 39/87  soft-tissue]
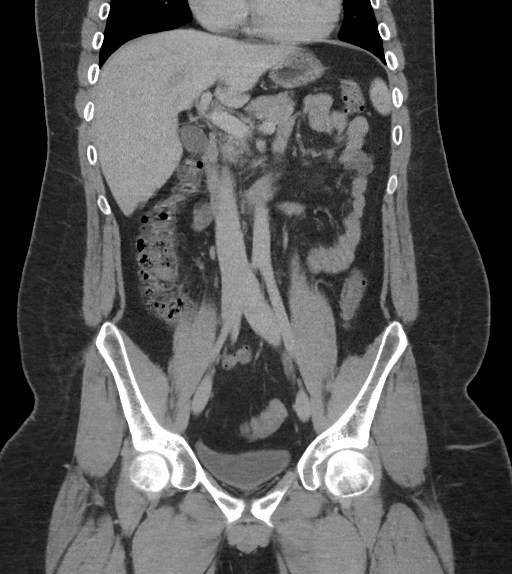
[im 48/87  soft-tissue]
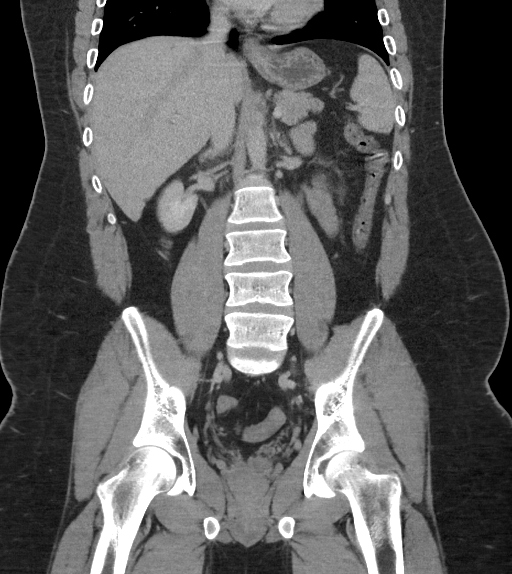

[17 of 46 positions shown; findings below may reference images not displayed]

FINDINGS: Lower chest: Lung bases are clear.

Hepatobiliary: No focal liver abnormality is seen. No gallstones,
gallbladder wall thickening, or biliary dilatation.

Pancreas: Unremarkable. No pancreatic ductal dilatation or
surrounding inflammatory changes.

Spleen: Normal in size without focal abnormality. No focal infarcts
identified.

Adrenals/Urinary Tract: Adrenal glands are unremarkable. Kidneys are
normal, without renal calculi, focal lesion, or hydronephrosis.
Bladder is unremarkable.

Stomach/Bowel: Stomach is within normal limits. Appendix appears
normal. No evidence of bowel wall thickening, distention, or
inflammatory changes.

Vascular/Lymphatic: No significant vascular findings are present. No
enlarged abdominal or pelvic lymph nodes.

Reproductive: Prostate is unremarkable.

Other: No abdominal wall hernia or abnormality. No abdominopelvic
ascites.

Musculoskeletal: No acute or significant osseous findings.
IMPRESSION: No acute process demonstrated in the abdomen or pelvis. No evidence
of splenic infarct.

## 2019-05-24 ENCOUNTER — Other Ambulatory Visit: Payer: Self-pay | Admitting: *Deleted

## 2019-05-24 DIAGNOSIS — F5101 Primary insomnia: Secondary | ICD-10-CM

## 2019-05-24 NOTE — Telephone Encounter (Signed)
Former Yetta Barre. NTBS LOV 05/18/18

## 2019-06-03 ENCOUNTER — Encounter: Payer: Self-pay | Admitting: Family Medicine

## 2019-06-03 ENCOUNTER — Telehealth (INDEPENDENT_AMBULATORY_CARE_PROVIDER_SITE_OTHER): Payer: BC Managed Care – PPO | Admitting: Family Medicine

## 2019-06-03 DIAGNOSIS — F5101 Primary insomnia: Secondary | ICD-10-CM | POA: Diagnosis not present

## 2019-06-03 DIAGNOSIS — R739 Hyperglycemia, unspecified: Secondary | ICD-10-CM

## 2019-06-03 MED ORDER — CLONIDINE HCL 0.2 MG PO TABS: 0.4000 mg | ORAL_TABLET | Freq: Every day | ORAL | 11 refills | Status: DC

## 2019-06-03 NOTE — Progress Notes (Signed)
MyChart Video visit  Subjective: CC: med refills PCP: Terald Sleeper, PA-C PVX:[id number] James Gilbert is James 24 y.o. male. Patient provides verbal consent for consult held via video.  Due to COVID-19 pandemic this visit was conducted virtually. This visit type was conducted due to national recommendations for restrictions regarding the COVID-19 Pandemic (e.g. social distancing, sheltering in place) in an effort to limit this patient's exposure and mitigate transmission in our community. All issues noted in this document were discussed and addressed.  James physical exam was not performed with this format.   Location of patient: car Location of provider: [facility] Others present for call: none  1. Insomnia Patient reports that he was started on clonidine 0.4 mg as James kid.  This was started for sleep.  He denies any behavioral disturbance including depression or anxiety symptoms.  Denies any blood pressure issues including hypertension or hypertensive symptoms.  They did try switching him to something else but he cannot remember the name of the medication.  He did not tolerate the change of medicine so he was switched back to clonidine.  He reports good rest from the medicine.  No adverse side effects.  ROS: Per HPI  No Known Allergies Past Medical History:  Diagnosis Date  . ADHD (attention deficit hyperactivity disorder)   . Anesthesia complication    nausea/ vomitting  . Varicella   . Vision abnormalities    wears glass    Current Outpatient Medications:  .  cloNIDine (CATAPRES) 0.2 MG tablet, Take 2 tablets (0.4 mg total) by mouth at bedtime., Disp: 60 tablet, Rfl: 11 .  ondansetron (ZOFRAN ODT) 8 MG disintegrating tablet, Take 1 tablet (8 mg total) by mouth every 8 (eight) hours as needed for nausea or vomiting., Disp: 20 tablet, Rfl: 0  Assessment/ Plan: 24 y.o. male   Primary insomnia - Plan: cloNIDine (CATAPRES) 0.2 MG tablet, CMP14+EGFR  Elevated serum glucose - Plan: CMP14+EGFR, Bayer DCA  Hb A1c Waived - stable. Consulted with pharmacy.  I was somewhat concerned about dose 0.34m daily.  This is James bit higher than standard dosing for this medication but apparently can be used at higher doses for certain conditions.  Since patient stable, this was continued.  Start time: 4:22pm End time: 4:30pm  Total time spent on patient care (including video visit/ documentation): 12 minutes  [place]([phone]

## 2020-05-16 ENCOUNTER — Other Ambulatory Visit: Payer: Self-pay | Admitting: Family Medicine

## 2020-05-16 DIAGNOSIS — F5101 Primary insomnia: Secondary | ICD-10-CM

## 2020-05-24 ENCOUNTER — Telehealth: Payer: Self-pay | Admitting: Family Medicine

## 2020-05-24 NOTE — Telephone Encounter (Signed)
Spoke with patient and advised him we cannot refill medication without him being seen first.  We rescheduled his June appointment to 06/06/20 at 4:00 pm with Dr. Nadine Counts.

## 2020-05-24 NOTE — Telephone Encounter (Signed)
  Prescription Request  05/24/2020  What is the name of the medication or equipment? clonidine  Have you contacted your pharmacy to request a refill? (if applicable) yes  Which pharmacy [facility] [place]  Pt has appt for 6/17 (first with PCP) and would like for a vm to be left if he does not answer    Patient notified that their request is being sent to the clinical staff for review and that they should receive a response within 2 business days.

## 2020-06-06 ENCOUNTER — Other Ambulatory Visit: Payer: Self-pay

## 2020-06-06 ENCOUNTER — Ambulatory Visit: Payer: BC Managed Care – PPO | Admitting: Family Medicine

## 2020-06-06 ENCOUNTER — Encounter: Payer: Self-pay | Admitting: Family Medicine

## 2020-06-06 VITALS — BP 164/105 | HR 84 | Temp 97.6°F | Ht 70.0 in | Wt 291.2 lb

## 2020-06-06 DIAGNOSIS — F5101 Primary insomnia: Secondary | ICD-10-CM | POA: Diagnosis not present

## 2020-06-06 DIAGNOSIS — Z0000 Encounter for general adult medical examination without abnormal findings: Secondary | ICD-10-CM

## 2020-06-06 DIAGNOSIS — Z0001 Encounter for general adult medical examination with abnormal findings: Secondary | ICD-10-CM | POA: Diagnosis not present

## 2020-06-06 DIAGNOSIS — R739 Hyperglycemia, unspecified: Secondary | ICD-10-CM | POA: Diagnosis not present

## 2020-06-06 DIAGNOSIS — R030 Elevated blood-pressure reading, without diagnosis of hypertension: Secondary | ICD-10-CM | POA: Diagnosis not present

## 2020-06-06 LAB — BAYER DCA HB A1C WAIVED: HB A1C (BAYER DCA - WAIVED): 5.5 % (ref <7.0)

## 2020-06-06 MED ORDER — CLONIDINE HCL 0.2 MG PO TABS: 0.4000 mg | ORAL_TABLET | Freq: Every day | ORAL | 11 refills | Status: DC

## 2020-06-06 NOTE — Patient Instructions (Signed)
You had labs performed today.  You will be contacted with the results of the labs once they are available, usually in the next 3 business days for routine lab work.  If you have an active my chart account, they will be released to your MyChart.  If you prefer to have these labs released to you via telephone, please let us know.     

## 2020-06-06 NOTE — Progress Notes (Signed)
James Gilbert is a 25 y.o. male presents to office today for annual physical exam examination.    Concerns today include: 1.  Insomnia/elevated blood pressure reading without diagnosis of hypertension Patient with longstanding history of insomnia.  He was started on clonidine 0.4 mg as a juvenile and this has been continued through his adult life.  He reports good sleep with this medicine.  He has tried coming off it in the past and it "made him feel terrible".  He describes that when he tried to abruptly discontinue in the past he had heart palpitations.  He is not known to have any high blood pressure.  He denies any chest pain, shortness of breath, edema, visual disturbance.  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 DTaP 12/27/1996, 02/28/1997, 04/25/1997, 05/02/1998, 06/23/2001  . Hepatitis A 09/22/2007, 05/19/2013  . Hepatitis B 11/29/1996, 12/27/1996, 07/20/1997  . HiB (PRP-OMP) 12/27/1996, 02/28/1997, 10/26/1997  . Hpv-Unspecified 05/19/2013, 12/08/2013, 04/20/2014  . IPV 12/27/1996, 02/28/1997, 04/25/1997, 06/23/2001  . Influenza,inj,Quad PF,6+ Mos 11/28/2017  . MMR 10/26/1997, 06/23/2001  . Meningococcal Conjugate 09/22/2007, 12/08/2013  . PFIZER(Purple Top)SARS-COV-2 Vaccination 09/15/2019, 10/06/2019, 03/16/2020  . Tdap 09/22/2007  . Varicella 05/02/1998    Past Medical History:  Diagnosis Date  . ADHD (attention deficit hyperactivity disorder)   . Anesthesia complication    nausea/ vomitting  . Varicella   . Vision abnormalities    wears glass   Social History   Socioeconomic History  . Marital status: Single    Spouse name: Not on file  . Number of children: Not on  file  . Years of education: Not on file  . Highest education level: Not on file  Occupational History  . Not on file  Tobacco Use  . Smoking status: Never Smoker  . Smokeless tobacco: Never Used  Vaping Use  . Vaping Use: Never used  Substance and Sexual Activity  . Alcohol use: No  . Drug use: No  . Sexual activity: Not on file  Other Topics Concern  . Not on file  Social History Narrative  . Not on file   Social Determinants of Health   Financial Resource Strain: Not on file  Food Insecurity: Not on file  Transportation Needs: Not on file  Physical Activity: Not on file  Stress: Not on file  Social Connections: Not on file  Intimate Partner Violence: Not on file   Past Surgical History:  Procedure Laterality Date  . ADENOIDECTOMY    . ORIF FEMUR FRACTURE Right 06/22/2013   Procedure: OPEN REDUCTION INTERNAL FIXATION MEDIAL CONDYLE;  Surgeon: James Pel, MD;  Location: [facility];  Service: Orthopedics;  Laterality: Right;  OPEN REDUCTION INTERNAL FIXATION MEDIAL CONDYLE  . ORIF TIBIA PLATEAU Right 06/22/2013   Procedure: OPEN REDUCTION INTERNAL FIXATION (ORIF) TIBIAL PLATEAU;  Surgeon: James Pel, MD;  Location: [facility];  Service: Orthopedics;  Laterality: Right;  . TONSILLECTOMY     Family History  Problem Relation Age of Onset  . Hypertension Father   . Diabetes Father   . Mental retardation Maternal Uncle   . Arthritis Maternal Grandmother   . Diabetes Paternal Grandmother     Current Outpatient Medications:  .  cloNIDine (CATAPRES) 0.2 MG tablet, Take 2 tablets (  0.4 mg total) by mouth at bedtime., Disp: 60 tablet, Rfl: 11  No Known Allergies   ROS: Review of Systems Pertinent items noted in HPI and remainder of comprehensive ROS otherwise negative.    Physical exam BP (!) 164/105   Pulse 84   Temp 97.6 F (36.4 C)   Ht '5\' 10"'  (1.778 m)   Wt 291 lb 3.2 oz (132.1 kg)   SpO2 97%   BMI 41.78 kg/m  General appearance: alert, cooperative,  appears stated age, no distress and morbidly obese Head: Normocephalic, without obvious abnormality, atraumatic Eyes: negative findings: lids and lashes normal, conjunctivae and sclerae normal, corneas clear and pupils equal, round, reactive to light and accomodation Ears: normal TM's and external ear canals both ears Nose: Nares normal. Septum midline. Mucosa normal. No drainage or sinus tenderness. Throat: lips, mucosa, and tongue normal; teeth and gums normal Neck: no adenopathy, supple, symmetrical, trachea midline and thyroid not enlarged, symmetric, no tenderness/mass/nodules Back: symmetric, no curvature. ROM normal. No CVA tenderness. Lungs: clear to auscultation bilaterally Chest wall: no tenderness Heart: regular rate and rhythm, S1, S2 normal, no murmur, click, rub or gallop Abdomen: soft, non-tender; bowel sounds normal; no masses,  no organomegaly Extremities: extremities normal, atraumatic, no cyanosis or edema Pulses: 2+ and symmetric Skin: Skin color, texture, turgor normal. No rashes or lesions Lymph nodes: Cervical, supraclavicular, and axillary nodes normal. Neurologic: Grossly normal  Assessment/ Plan: James Gilbert here for annual physical exam.   Annual physical exam  Primary insomnia - Plan: CMP14+EGFR, TSH, cloNIDine (CATAPRES) 0.2 MG tablet  Elevated serum glucose - Plan: Bayer DCA Hb A1c Waived  Elevated blood pressure reading in office without diagnosis of hypertension  Clonidine renewed but I do worry about possible rebound hypertension given elevations in blood pressure today.  I have advised him to follow-up in the next 2 weeks with nurse for blood pressure recheck.  We discussed potential wean from clonidine and transition on to something else if needed for sleep.  We discussed that clonidine for sleep is primarily is in the pediatric setting and not really in the adult setting.  Metabolic panel also ordered  Glucose noted to be elevated on previous  laboratory draw.  A1c however demonstrated no evidence of diabetes or prediabetes  DASH recommended.  Handout provided  Counseled on healthy lifestyle choices, including diet (rich in fruits, vegetables and lean meats and low in salt and simple carbohydrates) and exercise (at least 30 minutes of moderate physical activity daily).  Patient to follow up in 1 year for annual exam or sooner if needed.  Marisabel Macpherson M. Lajuana Ripple, DO

## 2020-06-07 LAB — TSH: TSH: 2.43 u[IU]/mL (ref 0.450–4.500)

## 2020-06-07 LAB — CMP14+EGFR
ALT: 29 IU/L (ref 0–44)
AST: 27 IU/L (ref 0–40)
Albumin/Globulin Ratio: 1.5 (ref 1.2–2.2)
Albumin: 4.6 g/dL (ref 4.1–5.2)
Alkaline Phosphatase: 96 IU/L (ref 44–121)
BUN/Creatinine Ratio: 10 (ref 9–20)
BUN: 10 mg/dL (ref 6–20)
Bilirubin Total: 0.4 mg/dL (ref 0.0–1.2)
CO2: 26 mmol/L (ref 20–29)
Calcium: 9.6 mg/dL (ref 8.7–10.2)
Chloride: 98 mmol/L (ref 96–106)
Creatinine, Ser: 1.03 mg/dL (ref 0.76–1.27)
Globulin, Total: 3 g/dL (ref 1.5–4.5)
Glucose: 98 mg/dL (ref 65–99)
Potassium: 4.9 mmol/L (ref 3.5–5.2)
Sodium: 139 mmol/L (ref 134–144)
Total Protein: 7.6 g/dL (ref 6.0–8.5)
eGFR: 105 mL/min/{1.73_m2} (ref >59)

## 2020-07-21 ENCOUNTER — Ambulatory Visit: Payer: BC Managed Care – PPO | Admitting: Family Medicine

## 2021-05-24 ENCOUNTER — Other Ambulatory Visit: Payer: Self-pay | Admitting: Family Medicine

## 2021-05-24 DIAGNOSIS — F5101 Primary insomnia: Secondary | ICD-10-CM

## 2021-05-24 NOTE — Telephone Encounter (Signed)
Gottsch[place]. 30 days given today Last OV 06/05/20 ?

## 2021-05-24 NOTE — Telephone Encounter (Signed)
Pt scheduled to see PCP on 06/20/21 for med refill. Left message for pt to call back to confirm appt date/time. ?

## 2021-06-20 ENCOUNTER — Other Ambulatory Visit: Payer: Self-pay

## 2021-06-20 ENCOUNTER — Ambulatory Visit: Payer: BC Managed Care – PPO | Admitting: Family Medicine

## 2021-06-20 DIAGNOSIS — F5101 Primary insomnia: Secondary | ICD-10-CM

## 2021-06-20 MED ORDER — CLONIDINE HCL 0.2 MG PO TABS: 0.4000 mg | ORAL_TABLET | Freq: Every day | ORAL | 0 refills | Status: DC

## 2021-06-21 ENCOUNTER — Other Ambulatory Visit: Payer: Self-pay | Admitting: Family Medicine

## 2021-06-21 DIAGNOSIS — F5101 Primary insomnia: Secondary | ICD-10-CM

## 2021-06-27 ENCOUNTER — Encounter: Payer: Self-pay | Admitting: Family Medicine

## 2021-06-27 ENCOUNTER — Ambulatory Visit (INDEPENDENT_AMBULATORY_CARE_PROVIDER_SITE_OTHER): Payer: BC Managed Care – PPO | Admitting: Family Medicine

## 2021-06-27 VITALS — BP 155/110 | HR 94 | Temp 98.5°F | Ht 70.0 in | Wt 305.0 lb

## 2021-06-27 DIAGNOSIS — Z0000 Encounter for general adult medical examination without abnormal findings: Secondary | ICD-10-CM

## 2021-06-27 DIAGNOSIS — I10 Essential (primary) hypertension: Secondary | ICD-10-CM | POA: Diagnosis not present

## 2021-06-27 DIAGNOSIS — F5101 Primary insomnia: Secondary | ICD-10-CM

## 2021-06-27 DIAGNOSIS — Z0001 Encounter for general adult medical examination with abnormal findings: Secondary | ICD-10-CM

## 2021-06-27 LAB — BAYER DCA HB A1C WAIVED: HB A1C (BAYER DCA - WAIVED): 5.4 % (ref 4.8–5.6)

## 2021-06-27 MED ORDER — TRAZODONE HCL 50 MG PO TABS: 25.0000 mg | ORAL_TABLET | Freq: Every evening | ORAL | 3 refills | Status: DC | PRN

## 2021-06-27 MED ORDER — CLONIDINE HCL 0.2 MG PO TABS: ORAL_TABLET | ORAL | 3 refills | Status: DC

## 2021-06-27 NOTE — Progress Notes (Signed)
James Gilbert is a 26 y.o. male presents to office today for annual physical exam examination.    Concerns today include: 1. Insomnia/mood Patient reports some waxing and waning mood disturbances.  Sometimes he feels totally fine but sometimes he will feel a little down or anxious.  He is always able to cope with this but just thought he might make mention of it as he has considered perhaps seeing a therapist.  He is currently in a monogamous relationship.  His girlfriend is currently vacationing in [place] but will be back soon.  She is going to nursing school and he actually just applied for a job with [employer].  He thinks that might be why his blood pressure is a little elevated because he literally just left the interview.  He feels like he sleeps really well with the clonidine he continues to take 0.4 mg nightly and has done so for years now.  Occupation: ?[place], Marital status: in a monogamous relationship, Substance use: none Immunizations needed: Immunization History  Administered Date(s) Administered   DTaP 12/27/1996, 02/28/1997, 04/25/1997, 05/02/1998, 06/23/2001   Hepatitis A 09/22/2007, 05/19/2013   Hepatitis B 11/29/1996, 12/27/1996, 07/20/1997   HiB (PRP-OMP) 12/27/1996, 02/28/1997, 10/26/1997   Hpv-Unspecified 05/19/2013, 12/08/2013, 04/20/2014   IPV 12/27/1996, 02/28/1997, 04/25/1997, 06/23/2001   Influenza,inj,Quad PF,6+ Mos 11/28/2017   MMR 10/26/1997, 06/23/2001   Meningococcal Conjugate 09/22/2007, 12/08/2013   PFIZER(Purple Top)SARS-COV-2 Vaccination 09/15/2019, 10/06/2019, 03/16/2020   Tdap 09/22/2007   Varicella 05/02/1998     Past Medical History:  Diagnosis Date   ADHD (attention deficit hyperactivity disorder)    Anesthesia complication    nausea/ vomitting   Varicella    Vision abnormalities    wears glass   Social History   Socioeconomic History   Marital status: Single    Spouse name: Not on file   Number of children: Not on  file   Years of education: Not on file   Highest education level: Not on file  Occupational History   Not on file  Tobacco Use   Smoking status: Never   Smokeless tobacco: Never  Vaping Use   Vaping Use: Never used  Substance and Sexual Activity   Alcohol use: No   Drug use: No   Sexual activity: Not on file  Other Topics Concern   Not on file  Social History Narrative   Not on file   Social Determinants of Health   Financial Resource Strain: Not on file  Food Insecurity: Not on file  Transportation Needs: Not on file  Physical Activity: Not on file  Stress: Not on file  Social Connections: Not on file  Intimate Partner Violence: Not on file   Past Surgical History:  Procedure Laterality Date   ADENOIDECTOMY     ORIF FEMUR FRACTURE Right 06/22/2013   Procedure: OPEN REDUCTION INTERNAL FIXATION MEDIAL CONDYLE;  Surgeon: Meredith Pel, MD;  Location: [facility];  Service: Orthopedics;  Laterality: Right;  OPEN REDUCTION INTERNAL FIXATION MEDIAL CONDYLE   ORIF TIBIA PLATEAU Right 06/22/2013   Procedure: OPEN REDUCTION INTERNAL FIXATION (ORIF) TIBIAL PLATEAU;  Surgeon: Meredith Pel, MD;  Location: [facility];  Service: Orthopedics;  Laterality: Right;   TONSILLECTOMY     Family History  Problem Relation Age of Onset   Hypertension Father    Diabetes Father    Mental retardation Maternal Uncle    Arthritis Maternal Grandmother    Diabetes Paternal Grandmother     Current Outpatient Medications:  cloNIDine (CATAPRES) 0.2 MG tablet, TAKE 2 TABLETS BY MOUTH AT BEDTIME, Disp: 60 tablet, Rfl: 0  No Known Allergies   ROS: Review of Systems Pertinent items noted in HPI and remainder of comprehensive ROS otherwise negative.    Physical exam BP (!) 155/110   Pulse 94   Temp 98.5 F (36.9 C)   Ht _0  (1.778 m)   Wt (!) 305 lb (138.3 kg)   SpO2 97%   BMI 43.76 kg/m  General appearance: alert, cooperative, appears stated age, and morbidly obese Head:  Normocephalic, without obvious abnormality, atraumatic Eyes: negative findings: lids and lashes normal, conjunctivae and sclerae normal, and corneas clear Ears: normal TM's and external ear canals both ears Nose: Nares normal. Septum midline. Mucosa normal. No drainage or sinus tenderness. Throat: lips, mucosa, and tongue normal; teeth and gums normal Neck: no adenopathy, supple, symmetrical, trachea midline, and thyroid not enlarged, symmetric, no tenderness/mass/nodules Back: symmetric, no curvature. ROM normal. No CVA tenderness. Lungs: clear to auscultation bilaterally Chest wall: no tenderness Heart: regular rate and rhythm, S1, S2 normal, no murmur, click, rub or gallop Abdomen: soft, non-tender; bowel sounds normal; no masses,  no organomegaly Extremities: extremities normal, atraumatic, no cyanosis or edema Pulses: 2+ and symmetric Skin: Skin color, texture, turgor normal. No rashes or lesions Lymph nodes: Cervical, supraclavicular, and axillary nodes normal. Neurologic: Grossly normal Psych: Mood stable.  Patient very pleasant, interactive     06/27/2021    2:51 PM 06/06/2020    3:51 PM 07/24/2017    3:11 PM  Depression screen PHQ 2/9  Decreased Interest 0 0 0  Down, Depressed, Hopeless 0 0 0  PHQ - 2 Score 0 0 0      06/27/2021    2:51 PM  GAD 7 : Generalized Anxiety Score  Nervous, Anxious, on Edge 0  Control/stop worrying 0  Worry too much - different things 0  Trouble relaxing 0  Restless 0  Easily annoyed or irritable 0  Afraid - awful might happen 0  Total GAD 7 Score 0  Anxiety Difficulty Not difficult at all    Assessment/ Plan: James Gilbert here for annual physical exam.   Annual physical exam  Primary insomnia - Plan: cloNIDine (CATAPRES) 0.2 MG tablet, traZODone (DESYREL) 50 MG tablet  Essential hypertension - Plan: CMP14+EGFR, CANCELED: LDL Cholesterol, Direct  Morbid obesity (HCC) - Plan: CMP14+EGFR, Bayer DCA Hb A1c Waived, Lipid Panel,  CANCELED: LDL Cholesterol, Direct  Going to try and taper him off of the clonidine as I do think he may be having some rebound hypertension as a result of this medicine.  He had primarily been utilizing it for insomnia so I am going to give him trazodone to have on hand if needed.  We will taper him alternating doses of 0.46m and 0.2 for 3 weeks then he can go down to 0.2 nightly for 3 weeks then 0.1 nightly for 3 weeks and then 0.1 q. OD for 3 weeks and then stop.  I would like him to monitor his blood pressures at home with goal of less than 140/90.  Dash eating plan provided.  We will reassess him again in 10 weeks.  Appointment made before he left today.  He will contact me if needed prior to that visit  Handout on healthy lifestyle choices, including diet (rich in fruits, vegetables and lean meats and low in salt and simple carbohydrates) and exercise (at least 30 minutes of moderate physical activity daily).  Patient  to follow up in 57m Jood Retana M. GLajuana Ripple DO

## 2021-06-27 NOTE — Patient Instructions (Addendum)
Wean from Clonidine.  I think this may be causing your high blood pressure. I have added Trazodone.  You may start this if needed for insomnia not controlled by clonidine Monitor blood pressure at home if able.  Goal <140/90  DASH Eating Plan DASH stands for Dietary Approaches to Stop Hypertension. The DASH eating plan is a healthy eating plan that has been shown to: Reduce high blood pressure (hypertension). Reduce your risk for type 2 diabetes, heart disease, and stroke. Help with weight loss. What are tips for following this plan? Reading food labels Check food labels for the amount of salt (sodium) per serving. Choose foods with less than 5 percent of the Daily Value of sodium. Generally, foods with less than 300 milligrams (mg) of sodium per serving fit into this eating plan. To find whole grains, look for the word "whole" as the first word in the ingredient list. Shopping Buy products labeled as "low-sodium" or "no salt added." Buy fresh foods. Avoid canned foods and pre-made or frozen meals. Cooking Avoid adding salt when cooking. Use salt-free seasonings or herbs instead of table salt or sea salt. Check with your health care provider or pharmacist before using salt substitutes. Do not fry foods. Cook foods using healthy methods such as baking, boiling, grilling, roasting, and broiling instead. Cook with heart-healthy oils, such as olive, canola, avocado, soybean, or sunflower oil. Meal planning  Eat a balanced diet that includes: 4 or more servings of fruits and 4 or more servings of vegetables each day. Try to fill one-half of your plate with fruits and vegetables. 6-8 servings of whole grains each day. Less than 6 oz (170 g) of lean meat, poultry, or fish each day. A 3-oz (85-g) serving of meat is about the same size as a deck of cards. One egg equals 1 oz (28 g). 2-3 servings of low-fat dairy each day. One serving is 1 cup (237 mL). 1 serving of nuts, seeds, or beans 5 times  each week. 2-3 servings of heart-healthy fats. Healthy fats called omega-3 fatty acids are found in foods such as walnuts, flaxseeds, fortified milks, and eggs. These fats are also found in cold-water fish, such as sardines, salmon, and mackerel. Limit how much you eat of: Canned or prepackaged foods. Food that is high in trans fat, such as some fried foods. Food that is high in saturated fat, such as fatty meat. Desserts and other sweets, sugary drinks, and other foods with added sugar. Full-fat dairy products. Do not salt foods before eating. Do not eat more than 4 egg yolks a week. Try to eat at least 2 vegetarian meals a week. Eat more home-cooked food and less restaurant, buffet, and fast food. Lifestyle When eating at a restaurant, ask that your food be prepared with less salt or no salt, if possible. If you drink alcohol: Limit how much you use to: 0-1 drink a day for women who are not pregnant. 0-2 drinks a day for men. Be aware of how much alcohol is in your drink. In the [place] one drink equals one 12 oz bottle of beer (355 mL), one 5 oz glass of wine (148 mL), or one 1 oz glass of hard liquor (44 mL). General information Avoid eating more than 2,300 mg of salt a day. If you have hypertension, you may need to reduce your sodium intake to 1,500 mg a day. Work with your health care provider to maintain a healthy body weight or to lose weight. Ask what an  ideal weight is for you. Get at least 30 minutes of exercise that causes your heart to beat faster (aerobic exercise) most days of the week. Activities may include walking, swimming, or biking. Work with your health care provider or dietitian to adjust your eating plan to your individual calorie needs. What foods should I eat? Fruits All fresh, dried, or frozen fruit. Canned fruit in natural juice (without added sugar). Vegetables Fresh or frozen vegetables (raw, steamed, roasted, or grilled). Low-sodium or reduced-sodium tomato  and vegetable juice. Low-sodium or reduced-sodium tomato sauce and tomato paste. Low-sodium or reduced-sodium canned vegetables. Grains Whole-grain or whole-wheat bread. Whole-grain or whole-wheat pasta. Brown rice. Orpah Cobb. Bulgur. Whole-grain and low-sodium cereals. Pita bread. Low-fat, low-sodium crackers. Whole-wheat flour tortillas. Meats and other proteins Skinless chicken or [place]. Ground chicken or [place]. Pork with fat trimmed off. Fish and seafood. Egg whites. Dried beans, peas, or lentils. Unsalted nuts, nut butters, and seeds. Unsalted canned beans. Lean cuts of beef with fat trimmed off. Low-sodium, lean precooked or cured meat, such as sausages or meat loaves. Dairy Low-fat (1%) or fat-free (skim) milk. Reduced-fat, low-fat, or fat-free cheeses. Nonfat, low-sodium ricotta or cottage cheese. Low-fat or nonfat yogurt. Low-fat, low-sodium cheese. Fats and oils Soft margarine without trans fats. Vegetable oil. Reduced-fat, low-fat, or light mayonnaise and salad dressings (reduced-sodium). Canola, safflower, olive, avocado, soybean, and sunflower oils. Avocado. Seasonings and condiments Herbs. Spices. Seasoning mixes without salt. Other foods Unsalted popcorn and pretzels. Fat-free sweets. The items listed above may not be a complete list of foods and beverages you can eat. Contact a dietitian for more information. What foods should I avoid? Fruits Canned fruit in a light or heavy syrup. Fried fruit. Fruit in cream or butter sauce. Vegetables Creamed or fried vegetables. Vegetables in a cheese sauce. Regular canned vegetables (not low-sodium or reduced-sodium). Regular canned tomato sauce and paste (not low-sodium or reduced-sodium). Regular tomato and vegetable juice (not low-sodium or reduced-sodium). Rosita Fire. Olives. Grains Baked goods made with fat, such as croissants, muffins, or some breads. Dry pasta or rice meal packs. Meats and other proteins Fatty cuts of meat. Ribs.  Fried meat. Tomasa Blase. Bologna, salami, and other precooked or cured meats, such as sausages or meat loaves. Fat from the back of a pig (fatback). Bratwurst. Salted nuts and seeds. Canned beans with added salt. Canned or smoked fish. Whole eggs or egg yolks. Chicken or [place] with skin. Dairy Whole or 2% milk, cream, and half-and-half. Whole or full-fat cream cheese. Whole-fat or sweetened yogurt. Full-fat cheese. Nondairy creamers. Whipped toppings. Processed cheese and cheese spreads. Fats and oils Butter. Stick margarine. Lard. Shortening. Ghee. Bacon fat. Tropical oils, such as coconut, palm kernel, or palm oil. Seasonings and condiments Onion salt, garlic salt, seasoned salt, table salt, and sea salt. Worcestershire sauce. Tartar sauce. Barbecue sauce. Teriyaki sauce. Soy sauce, including reduced-sodium. Steak sauce. Canned and packaged gravies. Fish sauce. Oyster sauce. Cocktail sauce. Store-bought horseradish. Ketchup. Mustard. Meat flavorings and tenderizers. Bouillon cubes. Hot sauces. Pre-made or packaged marinades. Pre-made or packaged taco seasonings. Relishes. Regular salad dressings. Other foods Salted popcorn and pretzels. The items listed above may not be a complete list of foods and beverages you should avoid. Contact a dietitian for more information. Where to find more information National Heart, Lung, and Blood Institute: PopSteam.is American Heart Association: www.heart.org Academy of Nutrition and Dietetics: www.eatright.org National Kidney Foundation: www.kidney.org Summary The DASH eating plan is a healthy eating plan that has been shown to reduce high blood  pressure (hypertension). It may also reduce your risk for type 2 diabetes, heart disease, and stroke. When on the DASH eating plan, aim to eat more fresh fruits and vegetables, whole grains, lean proteins, low-fat dairy, and heart-healthy fats. With the DASH eating plan, you should limit salt (sodium) intake to 2,300 mg  a day. If you have hypertension, you may need to reduce your sodium intake to 1,500 mg a day. Work with your health care provider or dietitian to adjust your eating plan to your individual calorie needs. This information is not intended to replace advice given to you by your health care provider. Make sure you discuss any questions you have with your health care provider. Document Revised: 12/25/2018 Document Reviewed: 12/25/2018 Elsevier Patient Education  [employer].

## 2021-06-28 LAB — CMP14+EGFR
ALT: 29 IU/L (ref 0–44)
AST: 23 IU/L (ref 0–40)
Albumin/Globulin Ratio: 1.5 (ref 1.2–2.2)
Albumin: 4.8 g/dL (ref 4.1–5.2)
Alkaline Phosphatase: 103 IU/L (ref 44–121)
BUN/Creatinine Ratio: 11 (ref 9–20)
BUN: 11 mg/dL (ref 6–20)
Bilirubin Total: 0.4 mg/dL (ref 0.0–1.2)
CO2: 22 mmol/L (ref 20–29)
Calcium: 9.9 mg/dL (ref 8.7–10.2)
Chloride: 99 mmol/L (ref 96–106)
Creatinine, Ser: 1.01 mg/dL (ref 0.76–1.27)
Globulin, Total: 3.3 g/dL (ref 1.5–4.5)
Glucose: 89 mg/dL (ref 70–99)
Potassium: 5 mmol/L (ref 3.5–5.2)
Sodium: 140 mmol/L (ref 134–144)
Total Protein: 8.1 g/dL (ref 6.0–8.5)
eGFR: 107 mL/min/{1.73_m2} (ref >59)

## 2021-06-28 LAB — LIPID PANEL
Chol/HDL Ratio: 2.4 ratio (ref 0.0–5.0)
Cholesterol, Total: 97 mg/dL — ABNORMAL LOW (ref 100–199)
HDL: 40 mg/dL (ref >39)
LDL Chol Calc (NIH): 45 mg/dL (ref 0–99)
Triglycerides: 44 mg/dL (ref 0–149)
VLDL Cholesterol Cal: 12 mg/dL (ref 5–40)

## 2021-09-05 ENCOUNTER — Encounter: Payer: Self-pay | Admitting: Family Medicine

## 2021-09-05 ENCOUNTER — Other Ambulatory Visit: Payer: Self-pay | Admitting: Family Medicine

## 2021-09-05 ENCOUNTER — Ambulatory Visit (INDEPENDENT_AMBULATORY_CARE_PROVIDER_SITE_OTHER): Payer: BC Managed Care – PPO | Admitting: Family Medicine

## 2021-09-05 VITALS — BP 133/89 | HR 91 | Temp 97.7°F | Ht 70.0 in | Wt 284.8 lb

## 2021-09-05 DIAGNOSIS — I10 Essential (primary) hypertension: Secondary | ICD-10-CM

## 2021-09-05 DIAGNOSIS — F5101 Primary insomnia: Secondary | ICD-10-CM | POA: Diagnosis not present

## 2021-09-05 NOTE — Progress Notes (Signed)
   Subjective: CC: Follow-up insomnia, elevation in blood pressure PCP: Raliegh Ip, DO James Gilbert is a 26 y.o. male presenting to clinic today for:  1.  Insomnia At last visit we discontinued the clonidine and transition patient over to trazodone for sleep.  He reports that sleep is well controlled trazodone.  He had no problems coming off clonidine  2.  Elevated blood pressure without diagnosis of hypertension No chest pain, shortness of breath, dizziness.  He has been really working on lifestyle modification has lost about 20 pounds since her last visit.  He is no longer seeing his girlfriend but is really trying to take care of himself right now.  He is a limited so it is totally   ROS: Per HPI  No Known Allergies Past Medical History:  Diagnosis Date   ADHD (attention deficit hyperactivity disorder)    Anesthesia complication    nausea/ vomitting   Varicella    Vision abnormalities    wears glass    Current Outpatient Medications:    traZODone (DESYREL) 50 MG tablet, Take 0.5-1 tablets (25-50 mg total) by mouth at bedtime as needed for sleep (to replace clonidine.)., Disp: 90 tablet, Rfl: 3 Social History   Socioeconomic History   Marital status: Single    Spouse name: Not on file   Number of children: Not on file   Years of education: Not on file   Highest education level: Not on file  Occupational History   Not on file  Tobacco Use   Smoking status: Never   Smokeless tobacco: Never  Vaping Use   Vaping Use: Never used  Substance and Sexual Activity   Alcohol use: No   Drug use: No   Sexual activity: Not on file  Other Topics Concern   Not on file  Social History Narrative   Not on file   Social Determinants of Health   Financial Resource Strain: Not on file  Food Insecurity: Not on file  Transportation Needs: Not on file  Physical Activity: Not on file  Stress: Not on file  Social Connections: Not on file  Intimate Partner Violence:  Not on file   Family History  Problem Relation Age of Onset   Hypertension Father    Diabetes Father    Mental retardation Maternal Uncle    Arthritis Maternal Grandmother    Diabetes Paternal Grandmother     Objective: Office vital signs reviewed. BP 133/89   Pulse 91   Temp 97.7 F (36.5 C)   Ht 5\' 10"  (1.778 m)   Wt 284 lb 12.8 oz (129.2 kg)   SpO2 95%   BMI 40.86 kg/m   Physical Examination:  General: Awake, alert, well nourished, obese.  No acute distress HEENT: Sclera white.  Moist mucous membranes Cardio: regular rate and rhythm, S1S2 heard, no murmurs appreciated Pulm: clear to auscultation bilaterally, no wheezes, rhonchi or rales; normal work of breathing on room air   Assessment/ Plan: 26 y.o. male   Primary insomnia  Morbid obesity (HCC)  Essential hypertension  Insomnia stable with trazodone.  No changes  Actively working on lifestyle modification and doing a great job with this.  Blood pressure upon recheck was normal   No orders of the defined types were placed in this encounter.  No orders of the defined types were placed in this encounter.    26, DO [facility] [phone]

## 2021-09-26 ENCOUNTER — Encounter: Payer: Self-pay | Admitting: *Deleted

## 2021-11-14 ENCOUNTER — Ambulatory Visit: Payer: BC Managed Care – PPO | Admitting: Family Medicine

## 2021-11-14 ENCOUNTER — Encounter: Payer: Self-pay | Admitting: Family Medicine

## 2021-11-14 VITALS — BP 135/79 | HR 69 | Temp 98.3°F | Ht 70.0 in | Wt 271.4 lb

## 2021-11-14 DIAGNOSIS — R1013 Epigastric pain: Secondary | ICD-10-CM | POA: Diagnosis not present

## 2021-11-14 DIAGNOSIS — Z23 Encounter for immunization: Secondary | ICD-10-CM | POA: Diagnosis not present

## 2021-11-14 NOTE — Progress Notes (Signed)
   Acute Office Visit  Subjective:     Patient ID: James Gilbert, male    DOB: [birth year], 26 y.o.   MRN: [id number]  Chief Complaint  Patient presents with   Abdominal Pain    Abdominal Pain This is a new problem. Episode onset: 3 days ago. The problem occurs intermittently (has occured twice). The pain is located in the epigastric region. The pain is moderate. The quality of the pain is aching. The abdominal pain radiates to the RUQ and LUQ. Associated symptoms include nausea and vomiting (1 episodes, NBNB). Pertinent negatives include no anorexia, belching, constipation, diarrhea, fever, flatus, frequency, headaches, hematochezia or hematuria. The pain is aggravated by eating (pain occured after greasy meal). The pain is relieved by Nothing. Treatments tried: pepto, gasX. Improvement on treatment: reports pain resolved 1 hour after taking medicaitons. His past medical history is significant for GERD. There is no history of Crohn's disease, gallstones, irritable bowel syndrome, pancreatitis, PUD or ulcerative colitis.     Review of Systems  Constitutional:  Negative for fever.  Gastrointestinal:  Positive for abdominal pain, nausea and vomiting (1 episodes, NBNB). Negative for anorexia, constipation, diarrhea, flatus and hematochezia.  Genitourinary:  Negative for frequency and hematuria.  Neurological:  Negative for headaches.        Objective:    BP 135/79   Pulse 69   Temp 98.3 F (36.8 C) (Temporal)   Ht 5\' 10"  (1.778 m)   Wt 271 lb 6 oz (123.1 kg)   SpO2 97%   BMI 38.94 kg/m    Physical Exam Vitals and nursing note reviewed.  Constitutional:      General: He is not in acute distress.    Appearance: He is not ill-appearing, toxic-appearing or diaphoretic.  HENT:     Head: Normocephalic and atraumatic.  Cardiovascular:     Rate and Rhythm: Normal rate and regular rhythm.  Pulmonary:     Effort: Pulmonary effort is normal. No respiratory distress.     Breath  sounds: Normal breath sounds.  Abdominal:     General: Bowel sounds are normal.     Palpations: Abdomen is soft.     Tenderness: There is no abdominal tenderness. There is no guarding or rebound. Negative signs include Murphy's sign and McBurney's sign.  Skin:    General: Skin is warm and dry.  Neurological:     General: No focal deficit present.     Mental Status: He is alert and oriented to person, place, and time.  Psychiatric:        Mood and Affect: Mood normal.        Behavior: Behavior normal.     No results found for any visits on 11/14/21.      Assessment & Plan:   James Gilbert was seen today for abdominal pain.  Diagnoses and all orders for this visit:  Epigastric abdominal pain 2 occurences after greasy meal. Benign exam today. Hx of GERD. Discussed symptoms are consistent with GERD. Discussed lifestyle management, OTC prn medication. If symptoms worsen or do not improve, will order RUQ [place] to assess gallbladder. Negative Murphy's sign on exam today.    Return if symptoms worsen or fail to improve.  The patient indicates understanding of these issues and agrees with the plan.  Gwenlyn Perking, FNP

## 2021-11-14 NOTE — Patient Instructions (Signed)
Food Choices for Gastroesophageal Reflux Disease, Adult When you have gastroesophageal reflux disease (GERD), the foods you eat and your eating habits are very important. Choosing the right foods can help ease the discomfort of GERD. Consider working with a dietitian to help you make healthy food choices. What are tips for following this plan? Reading food labels Look for foods that are low in saturated fat. Foods that have less than 5% of daily value (DV) of fat and 0 g of trans fats may help with your symptoms. Cooking Cook foods using methods other than frying. This may include baking, steaming, grilling, or broiling. These are all methods that do not need a lot of fat for cooking. To add flavor, try to use herbs that are low in spice and acidity. Meal planning  Choose healthy foods that are low in fat, such as fruits, vegetables, whole grains, low-fat dairy products, lean meats, fish, and poultry. Eat frequent, small meals instead of three large meals each day. Eat your meals slowly, in a relaxed setting. Avoid bending over or lying down until 2-3 hours after eating. Limit high-fat foods such as fatty meats or fried foods. Limit your intake of fatty foods, such as oils, butter, and shortening. Avoid the following as told by your health care provider: Foods that cause symptoms. These may be different for different people. Keep a food diary to keep track of foods that cause symptoms. Alcohol. Drinking large amounts of liquid with meals. Eating meals during the 2-3 hours before bed. Lifestyle Maintain a healthy weight. Ask your health care provider what weight is healthy for you. If you need to lose weight, work with your health care provider to do so safely. Exercise for at least 30 minutes on 5 or more days each week, or as told by your health care provider. Avoid wearing clothes that fit tightly around your waist and chest. Do not use any products that contain nicotine or tobacco. These  products include cigarettes, chewing tobacco, and vaping devices, such as e-cigarettes. If you need help quitting, ask your health care provider. Sleep with the head of your bed raised. Use a wedge under the mattress or blocks under the bed frame to raise the head of the bed. Chew sugar-free gum after mealtimes. What foods should I eat?  Eat a healthy, well-balanced diet of fruits, vegetables, whole grains, low-fat dairy products, lean meats, fish, and poultry. Each person is different. Foods that may trigger symptoms in one person may not trigger any symptoms in another person. Work with your health care provider to identify foods that are safe for you. The items listed above may not be a complete list of recommended foods and beverages. Contact a dietitian for more information. What foods should I avoid? Limiting some of these foods may help manage the symptoms of GERD. Everyone is different. Consult a dietitian or your health care provider to help you identify the exact foods to avoid, if any. Fruits Any fruits prepared with added fat. Any fruits that cause symptoms. For some people this may include citrus fruits, such as oranges, grapefruit, pineapple, and lemons. Vegetables Deep-fried vegetables. French fries. Any vegetables prepared with added fat. Any vegetables that cause symptoms. For some people, this may include tomatoes and tomato products, chili peppers, onions and garlic, and horseradish. Grains Pastries or quick breads with added fat. Meats and other proteins High-fat meats, such as fatty beef or pork, hot dogs, ribs, ham, sausage, salami, and bacon. Fried meat or protein, including   fried fish and fried chicken. Nuts and nut butters, in large amounts. Dairy Whole milk and chocolate milk. Sour cream. Cream. Ice cream. Cream cheese. Milkshakes. Fats and oils Butter. Margarine. Shortening. Ghee. Beverages Coffee and tea, with or without caffeine. Carbonated beverages. Sodas. Energy  drinks. Fruit juice made with acidic fruits, such as orange or grapefruit. Tomato juice. Alcoholic drinks. Sweets and desserts Chocolate and cocoa. Donuts. Seasonings and condiments Pepper. Peppermint and spearmint. Added salt. Any condiments, herbs, or seasonings that cause symptoms. For some people, this may include curry, hot sauce, or vinegar-based salad dressings. The items listed above may not be a complete list of foods and beverages to avoid. Contact a dietitian for more information. Questions to ask your health care provider Diet and lifestyle changes are usually the first steps that are taken to manage symptoms of GERD. If diet and lifestyle changes do not improve your symptoms, talk with your health care provider about taking medicines. Where to find more information [employer]: aboutgerd.org Summary When you have gastroesophageal reflux disease (GERD), food and lifestyle choices may be very helpful in easing the discomfort of GERD. Eat frequent, small meals instead of three large meals each day. Eat your meals slowly, in a relaxed setting. Avoid bending over or lying down until 2-3 hours after eating. Limit high-fat foods such as fatty meats or fried foods. This information is not intended to replace advice given to you by your health care provider. Make sure you discuss any questions you have with your health care provider. Document Revised: 08/02/2019 Document Reviewed: 08/02/2019 Elsevier Patient Education  2023 Elsevier Inc.  

## 2022-03-06 ENCOUNTER — Encounter: Payer: Self-pay | Admitting: Family Medicine

## 2022-03-06 ENCOUNTER — Telehealth (INDEPENDENT_AMBULATORY_CARE_PROVIDER_SITE_OTHER): Payer: BC Managed Care – PPO | Admitting: Family Medicine

## 2022-03-06 DIAGNOSIS — I10 Essential (primary) hypertension: Secondary | ICD-10-CM | POA: Diagnosis not present

## 2022-03-06 DIAGNOSIS — F5101 Primary insomnia: Secondary | ICD-10-CM | POA: Diagnosis not present

## 2022-03-06 MED ORDER — TRAZODONE HCL 50 MG PO TABS: 25.0000 mg | ORAL_TABLET | Freq: Every evening | ORAL | 3 refills | Status: DC | PRN

## 2022-03-06 NOTE — Progress Notes (Signed)
MyChart Video visit  Subjective: CC: Insomnia PCP: Janora Norlander, DO [id number] James Gilbert is James 27 y.o. male. Patient provides verbal consent for consult held via video.  Due to COVID-19 pandemic this visit was conducted virtually. This visit type was conducted due to national recommendations for restrictions regarding the COVID-19 Pandemic (e.g. social distancing, sheltering in place) in an effort to limit this patient's exposure and mitigate transmission in our community. All issues noted in this document were discussed and addressed.  James physical exam was not performed with this format.   Location of patient: Home Location of provider: [facility] Others present for call: None  1.  Insomnia  Patient reports that he has been doing relatively well.  He has been started self weaning from the trazodone and is only using it if needed now.  He feels that sleep has been better since he is lost some weight.  Continues on his weight loss journey but has not really checked the scales because he does not want to focus on that.  He continues to make good dietary choices and tries to stay physically active.   ROS: Per HPI  No Known Allergies Past Medical History:  Diagnosis Date   ADHD (attention deficit hyperactivity disorder)    Anesthesia complication    nausea/ vomitting   Varicella    Vision abnormalities    wears glass    Current Outpatient Medications:    traZODone (DESYREL) 50 MG tablet, Take 0.5-1 tablets (25-50 mg total) by mouth at bedtime as needed for sleep (to replace clonidine.)., Disp: 90 tablet, Rfl: 3  Gen: well appearing male, NAD HEENT: sclera white, MMM Pulm: normal WOB on room air Psych: very pleasant and interactive male.  Assessment/ Plan: 27 y.o. male   Primary insomnia - Plan: traZODone (DESYREL) 50 MG tablet  Morbid obesity ([place]) - Plan: CMP14+EGFR, Lipid panel  Essential hypertension - Plan: CMP14+EGFR, Lipid panel  Insomnia is doing well with sparing use  of trazodone.  I have renewed it just so he has it on hand if needed.  Continue weight loss journey with diet and exercise.  Fasting labs placed he may come in for this on Monday.  Lab appointment scheduled.  Nurse will check blood pressure and weight at that visit.  He may follow-up in 1 year, sooner if needed  Start time: 3:03pm End time: 3:14pm  Total time spent on patient care (including video visit/ documentation): 11 minutes  James Gilbert, James Gilbert [phone]

## 2022-03-07 ENCOUNTER — Ambulatory Visit: Payer: BC Managed Care – PPO | Admitting: Family Medicine

## 2022-03-11 ENCOUNTER — Other Ambulatory Visit: Payer: BC Managed Care – PPO

## 2022-03-11 DIAGNOSIS — I10 Essential (primary) hypertension: Secondary | ICD-10-CM | POA: Diagnosis not present

## 2022-03-12 LAB — CMP14+EGFR
ALT: 34 IU/L (ref 0–44)
AST: 26 IU/L (ref 0–40)
Albumin/Globulin Ratio: 1.6 (ref 1.2–2.2)
Albumin: 4.6 g/dL (ref 4.3–5.2)
Alkaline Phosphatase: 94 IU/L (ref 44–121)
BUN/Creatinine Ratio: 16 (ref 9–20)
BUN: 15 mg/dL (ref 6–20)
Bilirubin Total: 0.5 mg/dL (ref 0.0–1.2)
CO2: 23 mmol/L (ref 20–29)
Calcium: 9.4 mg/dL (ref 8.7–10.2)
Chloride: 99 mmol/L (ref 96–106)
Creatinine, Ser: 0.91 mg/dL (ref 0.76–1.27)
Globulin, Total: 2.8 g/dL (ref 1.5–4.5)
Glucose: 86 mg/dL (ref 70–99)
Potassium: 4.4 mmol/L (ref 3.5–5.2)
Sodium: 138 mmol/L (ref 134–144)
Total Protein: 7.4 g/dL (ref 6.0–8.5)
eGFR: 120 mL/min/{1.73_m2} (ref >59)

## 2022-03-12 LAB — LIPID PANEL
Chol/HDL Ratio: 2.8 ratio (ref 0.0–5.0)
Cholesterol, Total: 98 mg/dL — ABNORMAL LOW (ref 100–199)
HDL: 35 mg/dL — ABNORMAL LOW (ref >39)
LDL Chol Calc (NIH): 46 mg/dL (ref 0–99)
Triglycerides: 84 mg/dL (ref 0–149)
VLDL Cholesterol Cal: 17 mg/dL (ref 5–40)

## 2022-09-10 ENCOUNTER — Ambulatory Visit: Payer: BC Managed Care – PPO | Admitting: Family Medicine

## 2022-09-11 ENCOUNTER — Encounter: Payer: Self-pay | Admitting: Family Medicine

## 2022-09-17 ENCOUNTER — Ambulatory Visit: Payer: BC Managed Care – PPO | Admitting: Family Medicine

## 2022-09-17 ENCOUNTER — Encounter: Payer: Self-pay | Admitting: Family Medicine

## 2022-09-17 DIAGNOSIS — Z6836 Body mass index (BMI) 36.0-36.9, adult: Secondary | ICD-10-CM

## 2022-09-17 DIAGNOSIS — I10 Essential (primary) hypertension: Secondary | ICD-10-CM

## 2022-09-17 DIAGNOSIS — Z23 Encounter for immunization: Secondary | ICD-10-CM | POA: Diagnosis not present

## 2022-09-17 LAB — BAYER DCA HB A1C WAIVED: HB A1C (BAYER DCA - WAIVED): 5.3 % (ref 4.8–5.6)

## 2022-09-17 NOTE — Patient Instructions (Signed)
Congrats on the engagement!  You had labs performed today.  You will be contacted with the results of the labs once they are available, usually in the next 3 business days for routine lab work.  If you have an active my chart account, they will be released to your MyChart.  If you prefer to have these labs released to you via telephone, please let us know.

## 2022-09-17 NOTE — Progress Notes (Signed)
Subjective: CC: Insomnia PCP: Raliegh Ip, DO [id number] James Gilbert is James 27 y.o. male presenting to clinic today for:  1.  Insomnia/obesity He reports that his sleeping has regulated and he is no longer needing to take any sleep aids.  While he is not exercising as regularly as he had been he continues to follow James diet and actually reports to me today that he is now engaged to his girlfriend James Gilbert.  They are planning James November wedding in 2025.  He is thrilled about this.  Like has been pretty good since our last visit   ROS: Per HPI  No Known Allergies Past Medical History:  Diagnosis Date   ADHD (attention deficit hyperactivity disorder)    Anesthesia complication    nausea/ vomitting   Varicella    Vision abnormalities    wears glass   No current outpatient medications on file. Social History   Socioeconomic History   Marital status: Single    Spouse name: Not on file   Number of children: Not on file   Years of education: Not on file   Highest education level: Not on file  Occupational History   Not on file  Tobacco Use   Smoking status: Never   Smokeless tobacco: Never  Vaping Use   Vaping status: Never Used  Substance and Sexual Activity   Alcohol use: No   Drug use: No   Sexual activity: Not on file  Other Topics Concern   Not on file  Social History Narrative   Not on file   Social Determinants of Health   Financial Resource Strain: Not on file  Food Insecurity: Not on file  Transportation Needs: Not on file  Physical Activity: Not on file  Stress: Not on file  Social Connections: Not on file  Intimate Partner Violence: Not on file   Family History  Problem Relation Age of Onset   Hypertension Father    Diabetes Father    Mental retardation Maternal Uncle    Arthritis Maternal Grandmother    Diabetes Paternal Grandmother     Objective: Office vital signs reviewed. BP 138/82   Pulse 80   Temp 97.7 F (36.5 C)   Ht 5\' 10"   (1.778 m)   Wt 256 lb (116.1 kg)   SpO2 94%   BMI 36.73 kg/m   Physical Examination:  General: Awake, alert, well nourished, No acute distress HEENT: sclera white, MMM Cardio: regular rate and rhythm, S1S2 heard, no murmurs appreciated Pulm: clear to auscultation bilaterally, no wheezes, rhonchi or rales; normal work of breathing on room air Psych: Mood stable, speech normal, affect appropriate.  Very pleasant, interactive.  Happy appearing     09/17/2022   10:47 AM 11/14/2021    1:08 PM 09/05/2021    3:06 PM  Depression screen PHQ 2/9  Decreased Interest 0 0 0  Down, Depressed, Hopeless 0 0 0  PHQ - 2 Score 0 0 0  Altered sleeping  0   Tired, decreased energy  0   Change in appetite  0   Feeling bad or failure about yourself   0   Trouble concentrating  0   Moving slowly or fidgety/restless  0   Suicidal thoughts  0   PHQ-9 Score  0   Difficult doing work/chores  Not difficult at all    Assessment/ Plan: 27 y.o. male   Morbid obesity (HCC) - Plan: CMP14+EGFR, Lipid Panel, Bayer DCA Hb A1c Waived, TSH  Essential hypertension -  Plan: CMP14+EGFR, Lipid Panel, Bayer DCA Hb A1c Waived  Need for Tdap vaccination - Plan: Tdap vaccine greater than or equal to 7yo IM  Check fasting labs per patient request today.  Blood pressure is technically in the hypertensive state but does not require intervention at less than 140/90 today.  We administered his tetanus shot today and he may follow-up with me in 1 year for annual physical with fasting labs, sooner if concerns arise   Letroy Vazguez Hulen Skains, DO [facility] [phone]

## 2022-11-22 DIAGNOSIS — K219 Gastro-esophageal reflux disease without esophagitis: Secondary | ICD-10-CM | POA: Diagnosis not present

## 2022-11-22 DIAGNOSIS — R1110 Vomiting, unspecified: Secondary | ICD-10-CM | POA: Diagnosis not present

## 2022-11-22 DIAGNOSIS — K2900 Acute gastritis without bleeding: Secondary | ICD-10-CM | POA: Diagnosis not present

## 2022-11-22 DIAGNOSIS — R109 Unspecified abdominal pain: Secondary | ICD-10-CM | POA: Diagnosis not present

## 2022-11-25 ENCOUNTER — Ambulatory Visit: Payer: BC Managed Care – PPO

## 2022-12-24 ENCOUNTER — Ambulatory Visit: Payer: BC Managed Care – PPO | Admitting: Family Medicine

## 2022-12-24 ENCOUNTER — Encounter: Payer: Self-pay | Admitting: Family Medicine

## 2022-12-24 ENCOUNTER — Ambulatory Visit: Payer: Self-pay | Admitting: Internal Medicine

## 2022-12-24 VITALS — BP 128/76 | HR 81 | Temp 97.2°F | Ht 70.0 in | Wt 267.2 lb

## 2022-12-24 DIAGNOSIS — Z23 Encounter for immunization: Secondary | ICD-10-CM

## 2022-12-24 DIAGNOSIS — R079 Chest pain, unspecified: Secondary | ICD-10-CM

## 2022-12-24 DIAGNOSIS — K2100 Gastro-esophageal reflux disease with esophagitis, without bleeding: Secondary | ICD-10-CM

## 2022-12-24 MED ORDER — ESOMEPRAZOLE MAGNESIUM 40 MG PO CPDR: 40.0000 mg | DELAYED_RELEASE_CAPSULE | Freq: Two times a day (BID) | ORAL | 3 refills | Status: AC

## 2022-12-24 NOTE — Progress Notes (Signed)
Subjective:  Patient ID: James Gilbert, male    DOB: 05-Nov-1995  Age: 27 y.o. MRN: [id number]  CC: Chest Pain   HPI THIAGO CURE presents for Onset at hs last night. Still present this morning and through  the day today. Comes in waves. Epigastric, light pain. Worked all day today. Took a nap, but still there. 2-3/10 severity. Feels like his acid reflux. Taking Nexium on an empty stomach.      12/24/2022    4:08 PM 12/24/2022    4:03 PM 09/17/2022   10:47 AM  Depression screen PHQ 2/9  Decreased Interest 0 0 0  Down, Depressed, Hopeless 0 0 0  PHQ - 2 Score 0 0 0    History Alexandro has a past medical history of ADHD (attention deficit hyperactivity disorder), Anesthesia complication, Varicella, and Vision abnormalities.   He has a past surgical history that includes Adenoidectomy; Tonsillectomy; ORIF tibia plateau (Right, 06/22/2013); and ORIF femur fracture (Right, 06/22/2013).   His family history includes Arthritis in his maternal grandmother; Diabetes in his father and paternal grandmother; Hypertension in his father; Mental retardation in his maternal uncle.He reports that he has never smoked. He has never used smokeless tobacco. He reports that he does not drink alcohol and does not use drugs.    ROS Review of Systems  Constitutional:  Negative for fever.  Respiratory:  Negative for shortness of breath.   Cardiovascular:  Positive for chest pain.  Gastrointestinal:  Positive for abdominal pain. Negative for constipation, diarrhea, nausea and vomiting.  Musculoskeletal:  Negative for arthralgias.  Skin:  Negative for rash.    Objective:  BP 128/76   Pulse 81   Temp (!) 97.2 F (36.2 C)   Ht 5\' 10"  (1.778 m)   Wt 267 lb 3.2 oz (121.2 kg)   SpO2 97%   BMI 38.34 kg/m   BP Readings from Last 3 Encounters:  12/24/22 128/76  09/17/22 138/82  11/14/21 135/79    Wt Readings from Last 3 Encounters:  12/24/22 267 lb 3.2 oz (121.2 kg)  09/17/22 256 lb (116.1  kg)  11/14/21 271 lb 6 oz (123.1 kg)     Physical Exam Vitals reviewed.  Constitutional:      Appearance: He is well-developed.  HENT:     Head: Normocephalic and atraumatic.     Right Ear: External ear normal.     Left Ear: External ear normal.     Mouth/Throat:     Pharynx: No oropharyngeal exudate or posterior oropharyngeal erythema.  Eyes:     Pupils: Pupils are equal, round, and reactive to light.  Cardiovascular:     Rate and Rhythm: Normal rate and regular rhythm.     Heart sounds: No murmur heard. Pulmonary:     Effort: No respiratory distress.     Breath sounds: Normal breath sounds.  Musculoskeletal:     Cervical back: Normal range of motion and neck supple.  Neurological:     Mental Status: He is alert and oriented to person, place, and time.    EKG: NSR, no ischemic changes   Assessment & Plan:   Verner was seen today for chest pain.  Diagnoses and all orders for this visit:  Chest pain, unspecified type -     EKG 12-Lead  Need for influenza vaccination -     Flu vaccine trivalent PF, 6mos and older(Flulaval,Afluria,Fluarix,Fluzone)  Gastroesophageal reflux disease with esophagitis without hemorrhage -     Ambulatory referral to Gastroenterology  Other orders -     esomeprazole (NEXIUM) 40 MG capsule; Take 1 capsule (40 mg total) by mouth 2 (two) times daily before a meal.       I have changed Riki Rusk A. Wangerin's esomeprazole. I am also having him maintain his cetirizine.  Allergies as of 12/24/2022   No Known Allergies      Medication List        Accurate as of December 24, 2022  4:54 PM. If you have any questions, ask your nurse or doctor.          cetirizine 10 MG tablet Commonly known as: ZYRTEC Take 10 mg by mouth daily.   esomeprazole 40 MG capsule Commonly known as: NEXIUM Take 1 capsule (40 mg total) by mouth 2 (two) times daily before a meal. What changed: when to take this Changed by: Broadus John Sheamus Hasting          Follow-up: No follow-ups on file.  Mechele Claude, M.D.

## 2023-02-14 ENCOUNTER — Ambulatory Visit (INDEPENDENT_AMBULATORY_CARE_PROVIDER_SITE_OTHER): Payer: BC Managed Care – PPO | Admitting: Gastroenterology

## 2023-02-14 ENCOUNTER — Encounter: Payer: Self-pay | Admitting: Gastroenterology

## 2023-02-14 VITALS — BP 112/70 | HR 80 | Temp 98.6°F | Ht 71.0 in | Wt 280.8 lb

## 2023-02-14 DIAGNOSIS — R112 Nausea with vomiting, unspecified: Secondary | ICD-10-CM | POA: Diagnosis not present

## 2023-02-14 DIAGNOSIS — K219 Gastro-esophageal reflux disease without esophagitis: Secondary | ICD-10-CM | POA: Insufficient documentation

## 2023-02-14 DIAGNOSIS — R072 Precordial pain: Secondary | ICD-10-CM

## 2023-02-14 NOTE — Progress Notes (Signed)
 GI Office Note    Referring Provider: Jolinda Norene HERO, DO Primary Care Physician:  Jolinda Norene HERO, DO  Primary Gastroenterologist: Carlin POUR. Cindie, DO   Chief Complaint   Chief Complaint  Patient presents with   New Patient (Initial Visit)    Pt referred for GERD     History of Present Illness   James Gilbert is a 28 y.o. male presenting at the request of Dr. Zollie for GERD.    Patient states about 6 months ago he had his first episode of pain in the substernal region, described as pain/pressure, radiating into his back.  Never experienced anything like this before.  Tried multiple over-the-counter antacids, Gas-X.  Walked around trying to get relief.  Eventually after 2 hours symptoms decided.  He had no problems until October when he had recurrent symptoms.  He was seen in the ED at [facility] October 2024 presenting with pain in the substernal region radiating into his back.  Associated with nausea and vomiting.  Patient was diagnosed with GERD.  Started on esomeprazole  40 mg daily. Labs from ED visit: Hemoglobin 14.7, platelets are at 70,000, lipase 34, sodium 138, potassium 4.2, creatinine 1.05, albumin 4.2, total bilirubin 0.3, alkaline phosphatase 90, AST 19, ALT 26, troponin I less than 4.  Patient states in between these episodes he has had no issues.  Denies any typical heartburn type symptoms.  He believes that his symptoms were brought on by poor diet.  Often eating a lot of fast food and any wonders if several days of poor diet led up to these episodes.  He really has not made any dietary changes along the way.  Prior to onset of the for symptoms 6 months ago he was going to the gym more regularly but he did decrease his physical activity after the first episode.  He also has noted symptoms if he eats late at night and then lays down.    He was placed on esomeprazole  in October.  Really has had no problems since that time.  Recently PCP recommended  taking a second dose in the evening especially if he were to eat any typical trigger foods.  He has never had any dysphagia.  No unintentional weight loss.  No black or bloody stools.  Bowel movements otherwise regular.    Maternal grandfather has a history of esophageal dilation.     Wt Readings from Last 8 Encounters:  02/14/23 280 lb 12.8 oz (127.4 kg)  12/24/22 267 lb 3.2 oz (121.2 kg)  09/17/22 256 lb (116.1 kg)  11/14/21 271 lb 6 oz (123.1 kg)  09/05/21 284 lb 12.8 oz (129.2 kg)  06/27/21 (!) 305 lb (138.3 kg)  06/06/20 291 lb 3.2 oz (132.1 kg)  07/24/17 251 lb 12.8 oz (114.2 kg)    Medications   Current Outpatient Medications  Medication Sig Dispense Refill   cetirizine (ZYRTEC) 10 MG tablet Take 10 mg by mouth daily.     esomeprazole  (NEXIUM ) 40 MG capsule Take 1 capsule (40 mg total) by mouth 2 (two) times daily before a meal. 180 capsule 3   No current facility-administered medications for this visit.    Allergies   Allergies as of 02/14/2023   (No Known Allergies)    Past Medical History   Past Medical History:  Diagnosis Date   ADHD (attention deficit hyperactivity disorder)    Anesthesia complication    nausea/ vomitting   Varicella    Vision abnormalities  wears glass    Past Surgical History   Past Surgical History:  Procedure Laterality Date   ADENOIDECTOMY     ORIF FEMUR FRACTURE Right 06/22/2013   Procedure: OPEN REDUCTION INTERNAL FIXATION MEDIAL CONDYLE;  Surgeon: Cordella Glendia Hutchinson, MD;  Location: [facility] OR;  Service: Orthopedics;  Laterality: Right;  OPEN REDUCTION INTERNAL FIXATION MEDIAL CONDYLE   ORIF TIBIA PLATEAU Right 06/22/2013   Procedure: OPEN REDUCTION INTERNAL FIXATION (ORIF) TIBIAL PLATEAU;  Surgeon: Cordella Glendia Hutchinson, MD;  Location: [facility] OR;  Service: Orthopedics;  Laterality: Right;   TONSILLECTOMY     WISDOM TOOTH EXTRACTION      Past Family History   Family History  Problem Relation Age of Onset   Hypertension Father     Diabetes Father    Arthritis Maternal Grandmother    Bladder Cancer Maternal Grandfather    Other Maternal Grandfather        Esophageal dilations   Diabetes Paternal Grandmother    Mental retardation Maternal Uncle    Colon cancer Neg Hx     Past Social History   Social History   Socioeconomic History   Marital status: Single    Spouse name: Not on file   Number of children: Not on file   Years of education: Not on file   Highest education level: Not on file  Occupational History   Not on file  Tobacco Use   Smoking status: Never   Smokeless tobacco: Never  Vaping Use   Vaping status: Never Used  Substance and Sexual Activity   Alcohol use: No   Drug use: No   Sexual activity: Not on file  Other Topics Concern   Not on file  Social History Narrative   Not on file   Social Drivers of Health   Financial Resource Strain: Not on file  Food Insecurity: Not on file  Transportation Needs: Not on file  Physical Activity: Not on file  Stress: Not on file  Social Connections: Not on file  Intimate Partner Violence: Not on file    Review of Systems   General: Negative for anorexia, weight loss, fever, chills, fatigue, weakness. Eyes: Negative for vision changes.  ENT: Negative for hoarseness, difficulty swallowing , nasal congestion. CV: Negative for chest pain, angina, palpitations, dyspnea on exertion, peripheral edema.  Respiratory: Negative for dyspnea at rest, dyspnea on exertion, cough, sputum, wheezing.  GI: See history of present illness. GU:  Negative for dysuria, hematuria, urinary incontinence, urinary frequency, nocturnal urination.  MS: Negative for joint pain, low back pain.  Derm: Negative for rash or itching.  Neuro: Negative for weakness, abnormal sensation, seizure, frequent headaches, memory loss,  confusion.  Psych: Negative for anxiety, depression, suicidal ideation, hallucinations.  Endo: Negative for unusual weight change.  Heme: Negative for  bruising or bleeding. Allergy: Negative for rash or hives.  Physical Exam   BP 112/70   Pulse 80   Temp 98.6 F (37 C)   Ht 5' 11 (1.803 m)   Wt 280 lb 12.8 oz (127.4 kg)   BMI 39.16 kg/m    General: Well-nourished, well-developed in no acute distress.  Head: Normocephalic, atraumatic.   Eyes: Conjunctiva pink, no icterus. Mouth: Oropharyngeal mucosa moist and pinkNeck: Supple without thyromegaly, masses, or lymphadenopathy.  Lungs: Clear to auscultation bilaterally.  Heart: Regular rate and rhythm, no murmurs rubs or gallops.  Abdomen: Bowel sounds are normal, nontender, nondistended, no hepatosplenomegaly or masses,  no abdominal bruits or hernia, no rebound or guarding.  Rectal: Not performed Extremities: No lower extremity edema. No clubbing or deformities.  Neuro: Alert and oriented x 4 , grossly normal neurologically.  Skin: Warm and dry, no rash or jaundice.   Psych: Alert and cooperative, normal mood and affect.  Labs   Lab Results  Component Value Date   NA 140 09/17/2022   CL 102 09/17/2022   K 4.8 09/17/2022   CO2 26 09/17/2022   BUN 9 09/17/2022   CREATININE 1.03 09/17/2022   EGFR 103 09/17/2022   CALCIUM 9.3 09/17/2022   ALBUMIN 4.4 09/17/2022   GLUCOSE 90 09/17/2022   Lab Results  Component Value Date   ALT 16 09/17/2022   AST 18 09/17/2022   ALKPHOS 77 09/17/2022   BILITOT 0.4 09/17/2022   Lab Results  Component Value Date   WBC 14.8 (H) 01/16/2015   HGB 16.7 01/16/2015   HCT 49.0 01/16/2015   MCV 87.8 01/16/2015   PLT 237 01/16/2015   Lab Results  Component Value Date   TSH 1.640 09/17/2022    Imaging Studies   No results found.  Assessment/Plan:   Substernal pain with radiation into back associated with N/V: isolated episodes, with history of fatty diet. Cannot exclude biliary etiology but no RUQ pain, LFTs normal with work up. Could be reflux related. Less likely gastritis/PUD given isolated episodes ---Monitor for alarm  symptoms such as unintentional weight loss, abdominal pain, dysphagia, recurrent vomiting.  If any of these occur, would consider endoscopy.  Patient agreed with plan and was not interested in pursuing endoscopic evaluation at this time. ---Patient is interested in trying to wean off of esomeprazole .  He states he has been doing well since October.  Will try backing off to every other day for the next week or so and if tolerated then go down to 3 times weekly.  If remains stable, then he can try coming off of PPI therapy.  If he has recurrent regular symptoms, then he should restart PPI therapy. ---reinforced antireflux measures, hand out provided.  Sonny RAMAN. Ezzard, MHS, PA-C [facility]

## 2023-02-14 NOTE — Patient Instructions (Signed)
 Continue to avoid food triggers and over eating. Try to allow 2-3 hours between eating and laying down. See hand out below for anti-reflux measures.  Try weaning off esomeprazole  over the next several weeks. Initially drop down to one capful every other day. If tolerated over the course of a week, then you can try taking it 3 times per week. If continue to do well, then you can stop medication. You will need to restart if you are having reflux symptoms on a regular basis.  If you develop symptoms not controlled by esomeprazole , problems swallowing, unintentional weight loss, vomiting, then you should consider further work up with endoscopy.  Return to the office as needed. Call or use mychart for any questions or concerns.

## 2023-05-16 ENCOUNTER — Encounter: Admitting: Nurse Practitioner

## 2023-05-16 DIAGNOSIS — Z91199 Patient's noncompliance with other medical treatment and regimen due to unspecified reason: Secondary | ICD-10-CM

## 2023-05-16 NOTE — Progress Notes (Signed)
 No show

## 2023-05-21 ENCOUNTER — Ambulatory Visit: Admitting: Family Medicine

## 2023-05-21 ENCOUNTER — Encounter: Payer: Self-pay | Admitting: Family Medicine

## 2023-05-21 VITALS — BP 123/81 | HR 92 | Temp 96.8°F | Ht 71.0 in | Wt 285.6 lb

## 2023-05-21 DIAGNOSIS — R059 Cough, unspecified: Secondary | ICD-10-CM

## 2023-05-21 DIAGNOSIS — J301 Allergic rhinitis due to pollen: Secondary | ICD-10-CM | POA: Diagnosis not present

## 2023-05-21 MED ORDER — PROMETHAZINE-DM 6.25-15 MG/5ML PO SYRP: 5.0000 mL | ORAL_SOLUTION | Freq: Four times a day (QID) | ORAL | 0 refills | Status: DC | PRN

## 2023-05-21 MED ORDER — LEVOCETIRIZINE DIHYDROCHLORIDE 5 MG PO TABS: 5.0000 mg | ORAL_TABLET | Freq: Every evening | ORAL | 1 refills | Status: AC

## 2023-05-21 NOTE — Progress Notes (Signed)
 Subjective:  Patient ID: James Gilbert, male    DOB: [birth year], 28 y.o.   MRN: [id number]  Patient Care Team: Raliegh Ip, DO as PCP - General (Family Medicine) West Bali, MD (Inactive) as Consulting Physician (Gastroenterology)   Chief Complaint:  Cough (X 1 week )   HPI: James Gilbert is a 28 y.o. male presenting on 05/21/2023 for Cough (X 1 week )   History of Present Illness   James Gilbert is a 28 year old male who presents with persistent cough and throat discomfort.  He has been experiencing a persistent cough for several days, accompanied by a sensation of a 'lump in his throat' that does not resolve despite frequent coughing. The cough occurs both day and night and does not allow him to expectorate effectively.  Earlier in the week, he experienced symptoms suggestive of a sinus infection, including headache and sinus pressure, along with epistaxis one morning. These symptoms have resolved, except for the persistent cough.  He has been taking DayQuil, Nyquil, and Mucinex without relief for the cough. Nyquil helps him sleep but does not alleviate the cough. He has not used Robitussin or Delsym.  No fever, chills, or shortness of breath. Coughing fits can lead to headaches.  He has been on allergy medication for about six months and used Afrin for a couple of days at the beginning of the week to manage nasal symptoms. He is compliant with his reflux medication and denies recent heartburn.  He has not noticed anyone else being sick around him.       Relevant past medical, surgical, family, and social history reviewed and updated as indicated.  Allergies and medications reviewed and updated. Data reviewed: Chart in Epic.   Past Medical History:  Diagnosis Date   ADHD (attention deficit hyperactivity disorder)    Anesthesia complication    nausea/ vomitting   Varicella    Vision abnormalities    wears glass    Past Surgical History:   Procedure Laterality Date   ADENOIDECTOMY     ORIF FEMUR FRACTURE Right 06/22/2013   Procedure: OPEN REDUCTION INTERNAL FIXATION MEDIAL CONDYLE;  Surgeon: Cammy Copa, MD;  Location: [facility] OR;  Service: Orthopedics;  Laterality: Right;  OPEN REDUCTION INTERNAL FIXATION MEDIAL CONDYLE   ORIF TIBIA PLATEAU Right 06/22/2013   Procedure: OPEN REDUCTION INTERNAL FIXATION (ORIF) TIBIAL PLATEAU;  Surgeon: Cammy Copa, MD;  Location: [facility] OR;  Service: Orthopedics;  Laterality: Right;   TONSILLECTOMY     WISDOM TOOTH EXTRACTION      Social History   Socioeconomic History   Marital status: Single    Spouse name: Not on file   Number of children: Not on file   Years of education: Not on file   Highest education level: Not on file  Occupational History   Not on file  Tobacco Use   Smoking status: Never   Smokeless tobacco: Never  Vaping Use   Vaping status: Never Used  Substance and Sexual Activity   Alcohol use: No   Drug use: No   Sexual activity: Not on file  Other Topics Concern   Not on file  Social History Narrative   Not on file   Social Drivers of Health   Financial Resource Strain: Not on file  Food Insecurity: Not on file  Transportation Needs: Not on file  Physical Activity: Not on file  Stress: Not on file  Social Connections: Not on file  Intimate Partner Violence: Not on file    Outpatient Encounter Medications as of 05/21/2023  Medication Sig   esomeprazole (NEXIUM) 40 MG capsule Take 1 capsule (40 mg total) by mouth 2 (two) times daily before a meal.   levocetirizine (XYZAL) 5 MG tablet Take 1 tablet (5 mg total) by mouth every evening.   promethazine-dextromethorphan (PROMETHAZINE-DM) 6.25-15 MG/5ML syrup Take 5 mLs by mouth 4 (four) times daily as needed.   [DISCONTINUED] cetirizine (ZYRTEC) 10 MG tablet Take 10 mg by mouth daily.   No facility-administered encounter medications on file as of 05/21/2023.    No Known Allergies  Pertinent ROS per  HPI, otherwise unremarkable      Objective:  BP 123/81   Pulse 92   Temp (!) 96.8 F (36 C)   Ht 5\' 11"  (1.803 m)   Wt 285 lb 9.6 oz (129.5 kg)   SpO2 97%   BMI 39.83 kg/m    Wt Readings from Last 3 Encounters:  05/21/23 285 lb 9.6 oz (129.5 kg)  02/14/23 280 lb 12.8 oz (127.4 kg)  12/24/22 267 lb 3.2 oz (121.2 kg)    Physical Exam Vitals and nursing note reviewed.  Constitutional:      General: He is not in acute distress.    Appearance: Normal appearance. He is obese. He is not ill-appearing, toxic-appearing or diaphoretic.  HENT:     Head: Normocephalic and atraumatic.     Right Ear: Ear canal and external ear normal. A middle ear effusion is present. Tympanic membrane is not erythematous.     Left Ear: Ear canal and external ear normal. A middle ear effusion is present. Tympanic membrane is not erythematous.     Nose: Congestion present. No rhinorrhea.     Right Turbinates: Not enlarged.     Left Turbinates: Enlarged.     Right Sinus: No maxillary sinus tenderness or frontal sinus tenderness.     Left Sinus: No maxillary sinus tenderness or frontal sinus tenderness.     Mouth/Throat:     Lips: Pink.     Mouth: Mucous membranes are moist.     Pharynx: Oropharynx is clear. Postnasal drip present. No posterior oropharyngeal erythema.     Comments: Cobblestoning Eyes:     Conjunctiva/sclera: Conjunctivae normal.     Pupils: Pupils are equal, round, and reactive to light.  Cardiovascular:     Rate and Rhythm: Normal rate and regular rhythm.     Heart sounds: Normal heart sounds.  Pulmonary:     Effort: Pulmonary effort is normal.     Breath sounds: Normal breath sounds.  Musculoskeletal:     Cervical back: Neck supple.     Right lower leg: No edema.     Left lower leg: No edema.  Lymphadenopathy:     Cervical: No cervical adenopathy.  Skin:    General: Skin is warm and dry.     Capillary Refill: Capillary refill takes less than 2 seconds.  Neurological:      General: No focal deficit present.     Mental Status: He is alert and oriented to person, place, and time.  Psychiatric:        Mood and Affect: Mood normal.        Behavior: Behavior normal.        Thought Content: Thought content normal.        Judgment: Judgment normal.     Results for orders placed or performed in visit on 09/17/22  Bayer DCA Hb  A1c Waived   Collection Time: 09/17/22 11:13 AM  Result Value Ref Gilbert   HB A1C (BAYER DCA - WAIVED) 5.3 4.8 - 5.6 %  CMP14+EGFR   Collection Time: 09/17/22 11:14 AM  Result Value Ref Gilbert   Glucose 90 70 - 99 mg/dL   BUN 9 6 - 20 mg/dL   Creatinine, Ser 4.69 0.76 - 1.27 mg/dL   eGFR 629 >52 WU/XLK/4.40   BUN/Creatinine Ratio 9 9 - 20   Sodium 140 134 - 144 mmol/L   Potassium 4.8 3.5 - 5.2 mmol/L   Chloride 102 96 - 106 mmol/L   CO2 26 20 - 29 mmol/L   Calcium 9.3 8.7 - 10.2 mg/dL   Total Protein 7.1 6.0 - 8.5 g/dL   Albumin 4.4 4.3 - 5.2 g/dL   Globulin, Total 2.7 1.5 - 4.5 g/dL   Bilirubin Total 0.4 0.0 - 1.2 mg/dL   Alkaline Phosphatase 77 44 - 121 IU/L   AST 18 0 - 40 IU/L   ALT 16 0 - 44 IU/L  Lipid Panel   Collection Time: 09/17/22 11:14 AM  Result Value Ref Gilbert   Cholesterol, Total 97 (L) 100 - 199 mg/dL   Triglycerides 54 0 - 149 mg/dL   HDL 36 (L) >10 mg/dL   VLDL Cholesterol Cal 13 5 - 40 mg/dL   LDL Chol Calc (NIH) 48 0 - 99 mg/dL   Chol/HDL Ratio 2.7 0.0 - 5.0 ratio  TSH   Collection Time: 09/17/22 11:14 AM  Result Value Ref Gilbert   TSH 1.640 0.450 - 4.500 uIU/mL       Pertinent labs & imaging results that were available during my care of the patient were reviewed by me and considered in my medical decision making.  Assessment & Plan:  Jamiah was seen today for cough.  Diagnoses and all orders for this visit:  Seasonal allergic rhinitis due to pollen -     levocetirizine (XYZAL) 5 MG tablet; Take 1 tablet (5 mg total) by mouth every evening.  Cough in adult -     promethazine-dextromethorphan  (PROMETHAZINE-DM) 6.25-15 MG/5ML syrup; Take 5 mLs by mouth 4 (four) times daily as needed.     Assessment and Plan    Allergic Rhinitis Chronic postnasal drainage likely due to allergic rhinitis, causing cobblestoning in the oropharynx. Symptoms include persistent cough and globus sensation. Previous use of cetirizine with decreased effectiveness due to tolerance. Discussed the need to rotate antihistamines every three to four months to prevent tolerance. - Discontinue cetirizine and initiate levocetirizine. - Recommend daily fluticasone nasal spray for at least two weeks, though he prefers not to use it.  Cough Persistent cough likely secondary to postnasal drainage from allergic rhinitis. Previous use of dextromethorphan, guaifenesin, and combination cold medications without relief. Declined benzonatate due to past ineffectiveness. Prescribed promethazine DM for nighttime use due to sedative effects. Dextromethorphan recommended for daytime use as it is effective and non-sedative. - Prescribe promethazine DM for nighttime use due to sedative effects. - Recommend over-the-counter dextromethorphan for daytime use as it is effective and non-sedative.  Gastroesophageal Reflux Disease (GERD) He is compliant with reflux medication and reports no recent increase in heartburn symptoms.  Follow-up Monitor symptoms and response to new medications. - Instruct him to contact the office if symptoms worsen or new symptoms develop.          Continue all other maintenance medications.  Follow up plan: Return if symptoms worsen or fail to improve.   Continue  healthy lifestyle choices, including diet (rich in fruits, vegetables, and lean proteins, and low in salt and simple carbohydrates) and exercise (at least 30 minutes of moderate physical activity daily).   The above assessment and management plan was discussed with the patient. The patient verbalized understanding of and has agreed to the  management plan. Patient is aware to call the clinic if they develop any new symptoms or if symptoms persist or worsen. Patient is aware when to return to the clinic for a follow-up visit. Patient educated on when it is appropriate to go to the emergency department.   Kattie Parrot, FNP-C [facility] [phone]

## 2023-09-23 ENCOUNTER — Encounter: Payer: Self-pay | Admitting: Family Medicine

## 2023-09-23 ENCOUNTER — Encounter: Payer: BC Managed Care – PPO | Admitting: Family Medicine

## 2023-09-23 NOTE — Progress Notes (Deleted)
 James Gilbert is a 28 y.o. male presents to office today for annual physical exam examination.    Concerns today include: 1. ***  Occupation: ***, Marital status: ***, Substance use: *** Health Maintenance Due  Topic Date Due   HIV Screening  Never done   Hepatitis C Screening  Never done   COVID-19 Vaccine (4 - 2024-25 season) 10/06/2022   INFLUENZA VACCINE  09/05/2023   Refills needed today: ***  Immunization History  Administered Date(s) Administered   DTaP 12/27/1996, 02/28/1997, 04/25/1997, 05/02/1998, 06/23/2001   HIB (PRP-OMP) 12/27/1996, 02/28/1997, 10/26/1997   Hepatitis A 09/22/2007, 05/19/2013   Hepatitis B 11/29/1996, 12/27/1996, 07/20/1997   Hpv-Unspecified 05/19/2013, 12/08/2013, 04/20/2014   IPV 12/27/1996, 02/28/1997, 04/25/1997, 06/23/2001   Influenza, Seasonal, Injecte, Preservative Fre 12/24/2022   Influenza,inj,Quad PF,6+ Mos 11/28/2017, 11/14/2021   MMR 10/26/1997, 06/23/2001   Meningococcal Conjugate 09/22/2007, 12/08/2013   PFIZER(Purple Top)SARS-COV-2 Vaccination 09/15/2019, 10/06/2019, 03/16/2020   Tdap 09/22/2007, 09/17/2022   Varicella 05/02/1998   Past Medical History:  Diagnosis Date   ADHD (attention deficit hyperactivity disorder)    Anesthesia complication    nausea/ vomitting   Varicella    Vision abnormalities    wears glass   Social History   Socioeconomic History   Marital status: Single    Spouse name: Not on file   Number of children: Not on file   Years of education: Not on file   Highest education level: Not on file  Occupational History   Not on file  Tobacco Use   Smoking status: Never   Smokeless tobacco: Never  Vaping Use   Vaping status: Never Used  Substance and Sexual Activity   Alcohol use: No   Drug use: No   Sexual activity: Not on file  Other Topics Concern   Not on file  Social History Narrative   Not on file   Social Drivers of Health   Financial Resource Strain: Not on file  Food  Insecurity: Not on file  Transportation Needs: Not on file  Physical Activity: Not on file  Stress: Not on file  Social Connections: Not on file  Intimate Partner Violence: Not on file   Past Surgical History:  Procedure Laterality Date   ADENOIDECTOMY     ORIF FEMUR FRACTURE Right 06/22/2013   Procedure: OPEN REDUCTION INTERNAL FIXATION MEDIAL CONDYLE;  Surgeon: Cordella Glendia Hutchinson, MD;  Location: [facility] OR;  Service: Orthopedics;  Laterality: Right;  OPEN REDUCTION INTERNAL FIXATION MEDIAL CONDYLE   ORIF TIBIA PLATEAU Right 06/22/2013   Procedure: OPEN REDUCTION INTERNAL FIXATION (ORIF) TIBIAL PLATEAU;  Surgeon: Cordella Glendia Hutchinson, MD;  Location: [facility] OR;  Service: Orthopedics;  Laterality: Right;   TONSILLECTOMY     WISDOM TOOTH EXTRACTION     Family History  Problem Relation Age of Onset   Hypertension Father    Diabetes Father    Arthritis Maternal Grandmother    Bladder Cancer Maternal Grandfather    Other Maternal Grandfather        Esophageal dilations   Diabetes Paternal Grandmother    Mental retardation Maternal Uncle    Colon cancer Neg Hx     Current Outpatient Medications:    esomeprazole  (NEXIUM ) 40 MG capsule, Take 1 capsule (40 mg total) by mouth 2 (two) times daily before a meal., Disp: 180 capsule, Rfl: 3   levocetirizine (XYZAL ) 5 MG tablet, Take 1 tablet (5 mg total) by mouth every evening., Disp: 90 tablet, Rfl: 1  No Known Allergies  ROS: Review of Systems {ros; complete:30496}    Physical exam {Exam, [phone]}      05/21/2023    1:56 PM 12/24/2022    4:08 PM 12/24/2022    4:03 PM  Depression screen PHQ 2/9  Decreased Interest 0 0 0  Down, Depressed, Hopeless 0 0 0  PHQ - 2 Score 0 0 0  Altered sleeping 0    Tired, decreased energy 0    Change in appetite 0    Feeling bad or failure about yourself  0    Trouble concentrating 0    Moving slowly or fidgety/restless 0    Suicidal thoughts 0    PHQ-9 Score 0    Difficult doing  work/chores Not difficult at all        05/21/2023    1:56 PM 11/14/2021    1:08 PM 09/05/2021    3:06 PM 06/27/2021    2:51 PM  GAD 7 : Generalized Anxiety Score  Nervous, Anxious, on Edge 0 0 0 0  Control/stop worrying 0 0 0 0  Worry too much - different things 0 0 0 0  Trouble relaxing 0 0 0 0  Restless 0 0 0 0  Easily annoyed or irritable 0 0 0 0  Afraid - awful might happen 0 0 0 0  Total GAD 7 Score 0 0 0 0  Anxiety Difficulty Not difficult at all Not difficult at all Not difficult at all Not difficult at all     Assessment/ Plan: James Gilbert here for annual physical exam.   Annual physical exam  Seasonal allergic rhinitis due to pollen  Gastroesophageal reflux disease with esophagitis without hemorrhage  ***  Counseled on healthy lifestyle choices, including diet (rich in fruits, vegetables and lean meats and low in salt and simple carbohydrates) and exercise (at least 30 minutes of moderate physical activity daily).  Patient to follow up ***  Tiara Bartoli M. Jolinda, DO
